# Patient Record
Sex: Male | Born: 1955 | Race: White | Hispanic: No | Marital: Married | Smoking: Never smoker
Health system: Southern US, Community
[De-identification: ages and names within clinical notes are randomized; demographics above are authoritative.]

## PROBLEM LIST (undated history)

## (undated) DIAGNOSIS — N200 Calculus of kidney: Secondary | ICD-10-CM

## (undated) DIAGNOSIS — F419 Anxiety disorder, unspecified: Secondary | ICD-10-CM

## (undated) DIAGNOSIS — E785 Hyperlipidemia, unspecified: Secondary | ICD-10-CM

## (undated) DIAGNOSIS — J301 Allergic rhinitis due to pollen: Secondary | ICD-10-CM

## (undated) DIAGNOSIS — B019 Varicella without complication: Secondary | ICD-10-CM

## (undated) DIAGNOSIS — J302 Other seasonal allergic rhinitis: Secondary | ICD-10-CM

## (undated) DIAGNOSIS — I10 Essential (primary) hypertension: Secondary | ICD-10-CM

## (undated) DIAGNOSIS — K219 Gastro-esophageal reflux disease without esophagitis: Secondary | ICD-10-CM

## (undated) DIAGNOSIS — I493 Ventricular premature depolarization: Secondary | ICD-10-CM

## (undated) DIAGNOSIS — F329 Major depressive disorder, single episode, unspecified: Secondary | ICD-10-CM

## (undated) DIAGNOSIS — F32A Depression, unspecified: Secondary | ICD-10-CM

## (undated) HISTORY — DX: Calculus of kidney: N20.0

## (undated) HISTORY — DX: Ventricular premature depolarization: I49.3

## (undated) HISTORY — DX: Other seasonal allergic rhinitis: J30.2

## (undated) HISTORY — DX: Varicella without complication: B01.9

## (undated) HISTORY — DX: Anxiety disorder, unspecified: F41.9

## (undated) HISTORY — DX: Allergic rhinitis due to pollen: J30.1

## (undated) HISTORY — DX: Essential (primary) hypertension: I10

## (undated) HISTORY — DX: Gastro-esophageal reflux disease without esophagitis: K21.9

## (undated) HISTORY — DX: Depression, unspecified: F32.A

## (undated) HISTORY — DX: Hyperlipidemia, unspecified: E78.5

## (undated) HISTORY — PX: NO PAST SURGERIES: SHX2092

---
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
## 2019-07-30 ENCOUNTER — Emergency Department
Admission: EM | Admit: 2019-07-30 | Discharge: 2019-07-30 | Disposition: A | Discharge status: Home / Self Care | Payer: BC Managed Care – PPO | Attending: Emergency Medicine | Admitting: Emergency Medicine

## 2019-07-30 ENCOUNTER — Emergency Department: Payer: BC Managed Care – PPO

## 2019-07-30 ENCOUNTER — Other Ambulatory Visit: Payer: Self-pay

## 2019-07-30 DIAGNOSIS — I493 Ventricular premature depolarization: Secondary | ICD-10-CM | POA: Insufficient documentation

## 2019-07-30 DIAGNOSIS — R002 Palpitations: Secondary | ICD-10-CM | POA: Diagnosis present

## 2019-07-30 LAB — BASIC METABOLIC PANEL
Anion gap: 8 (ref 5–15)
BUN: 26 mg/dL — ABNORMAL HIGH (ref 8–23)
CO2: 24 mmol/L (ref 22–32)
Calcium: 8.8 mg/dL — ABNORMAL LOW (ref 8.9–10.3)
Chloride: 107 mmol/L (ref 98–111)
Creatinine, Ser: 1.11 mg/dL (ref 0.61–1.24)
GFR calc Af Amer: >60 mL/min (ref >60)
GFR calc non Af Amer: >60 mL/min (ref >60)
Glucose, Bld: 104 mg/dL — ABNORMAL HIGH (ref 70–99)
Potassium: 4.2 mmol/L (ref 3.5–5.1)
Sodium: 139 mmol/L (ref 135–145)

## 2019-07-30 LAB — CBC
HCT: 42.7 % (ref 39.0–52.0)
Hemoglobin: 14.8 g/dL (ref 13.0–17.0)
MCH: 29 pg (ref 26.0–34.0)
MCHC: 34.7 g/dL (ref 30.0–36.0)
MCV: 83.7 fL (ref 80.0–100.0)
Platelets: 240 10*3/uL (ref 150–400)
RBC: 5.1 MIL/uL (ref 4.22–5.81)
RDW: 12 % (ref 11.5–15.5)
WBC: 5.6 10*3/uL (ref 4.0–10.5)
nRBC: 0 % (ref 0.0–0.2)

## 2019-07-30 LAB — TROPONIN I (HIGH SENSITIVITY): Troponin I (High Sensitivity): 4 ng/L (ref <18)

## 2019-07-30 MED ORDER — SODIUM CHLORIDE 0.9% FLUSH: 3.0000 mL | Freq: Once | INTRAVENOUS | Status: DC

## 2019-07-30 NOTE — ED Notes (Signed)
Pt c/o feeling like ",my heart skips a beat" states it has only happened once today, states it has been occurring over the past week and his parents have a hx of stroke and saw a commercial that talked about the increased risk of stroke if you have an irregular HB.Marland Kitchen pt is in NAD. Denies any pain or SOB. 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

## 2019-07-30 NOTE — ED Triage Notes (Signed)
Reports "missed beat" a few times per day for last week. Pt also reports bilateral tingling in arms over past few days-no decrease in strength. Denies CP or SOB.

## 2019-07-30 NOTE — ED Provider Notes (Signed)
[facility] Emergency Department Provider Note   ____________________________________________    I have reviewed the triage vital signs and the nursing notes.   HISTORY  Chief Complaint Palpitations     HPI Tim Reynolds is a 63 y.o. male who reports that over the last week he has noticed a sensation of having an extra beat intermittently.  He reports this happens perhaps 6-10 times per day.  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  He denies chest pain.  No shortness of breath.  No cough fever or chills.  Reports he has been drinking increased coffee.  Currently feels quite well, went to urgent care and they sent him here for evaluation.  History reviewed. No pertinent past medical history.  There are no problems to display for this patient.   History reviewed. No pertinent surgical history.  Prior to Admission medications   Not on File     Allergies Patient has no known allergies.  No family history on file.  Social History Does not smoke, does not drink alcohol frequently  Review of Systems  Constitutional: No fever/chills Eyes: No visual changes.  ENT: No sore throat. Cardiovascular: Denies chest pain.  As above Respiratory: Denies shortness of breath. Gastrointestinal: No abdominal pain.  Genitourinary: Negative for dysuria. Musculoskeletal: Negative for back pain. Skin: Negative for rash. Neurological: Negative for headaches   ____________________________________________   PHYSICAL EXAM:  VITAL SIGNS: ED Triage Vitals  Enc Vitals Group     BP 07/30/19 0915 (!) 150/82     Pulse Rate 07/30/19 0915 81     Resp 07/30/19 0915 16     Temp 07/30/19 0915 98.4 F (36.9 C)     Temp Source 07/30/19 0915 Oral     SpO2 07/30/19 0915 97 %     Weight 07/30/19 0916 72.6 kg (160 lb)     Height 07/30/19 0916 1.702 m (5\' 7" )   Head Circumference --      Peak Flow --      Pain Score 07/30/19 0916 0     Pain Loc --      Pain Edu? --      Excl. in [place]? --     Constitutional: Alert and oriented. No acute distress. Pleasant and interactive   Mouth/Throat: Mucous membranes are moist.   Neck:  Painless ROM Cardiovascular: Normal rate, regular rhythm. Grossly normal heart sounds.  Good peripheral circulation. Respiratory: Normal respiratory effort.  No retractions. Lungs CTAB. Gastrointestinal: Soft and nontender. No distention.    Musculoskeletal:.  Warm and well perfused Neurologic:  Normal speech and language. No gross focal neurologic deficits are appreciated.  Skin:  Skin is warm, dry and intact. No rash noted. Psychiatric: Mood and affect are normal. Speech and behavior are normal.  ____________________________________________   LABS (all labs ordered are listed, but only abnormal results are displayed)  Labs Reviewed  BASIC METABOLIC PANEL - Abnormal; Notable for the following components:      Result Value   Glucose, Bld 104 (*)    BUN 26 (*)    Calcium 8.8 (*)    All other components within normal limits  CBC  TROPONIN I (HIGH SENSITIVITY)   ____________________________________________  EKG  ED ECG REPORT I, Lavonia Drafts, the attending physician, personally viewed and interpreted this ECG.  Date: 07/30/2019  Rhythm: normal sinus rhythm QRS  Axis: normal Intervals: normal ST/T Wave abnormalities: normal Narrative Interpretation: no evidence of acute ischemia  ____________________________________________  RADIOLOGY  Chest x-ray unremarkable ____________________________________________   PROCEDURES  Procedure(s) performed: No  Procedures   Critical Care performed: No ____________________________________________   INITIAL IMPRESSION / ASSESSMENT AND PLAN / ED COURSE  Pertinent labs & imaging results that were available during my care of the patient were reviewed by me and  considered in my medical decision making (see chart for details).  Patient well-appearing and in no acute distress, asymptomatic at this time.  Symptoms are consistent with PVC, noted occasional PVC on the monitor.  Discussed with him that if symptoms become worse option for beta-blocker and recommend cardiology follow-up.  However recommend decreasing caffeine and increasing exercise and monitoring closely at this time.  Patient agrees with this plan    ____________________________________________   FINAL CLINICAL IMPRESSION(S) / ED DIAGNOSES  Final diagnoses:  PVC (premature ventricular contraction)        Note:  This document was prepared using Dragon voice recognition software and may include unintentional dictation errors.   Lavonia Drafts, MD 07/30/19 1051

## 2019-07-30 NOTE — ED Notes (Signed)
Patient transported to X-ray 

## 2019-08-21 ENCOUNTER — Encounter: Payer: Self-pay | Admitting: *Deleted

## 2019-08-27 ENCOUNTER — Ambulatory Visit (INDEPENDENT_AMBULATORY_CARE_PROVIDER_SITE_OTHER): Payer: BC Managed Care – PPO | Admitting: Cardiovascular Disease

## 2019-08-27 ENCOUNTER — Encounter: Payer: Self-pay | Admitting: Cardiovascular Disease

## 2019-08-27 ENCOUNTER — Other Ambulatory Visit: Payer: Self-pay

## 2019-08-27 VITALS — BP 133/86 | HR 78 | Ht 67.0 in | Wt 178.5 lb

## 2019-08-27 DIAGNOSIS — I493 Ventricular premature depolarization: Secondary | ICD-10-CM

## 2019-08-27 DIAGNOSIS — Z0000 Encounter for general adult medical examination without abnormal findings: Secondary | ICD-10-CM

## 2019-08-27 NOTE — Progress Notes (Signed)
Cardiology Office Note  Date:  08/27/2019   ID:  Youlanda Roys [place], DOB 04/05/1956, MRN [id number]  PCP:  Patient, No Pcp Per   Chief Complaint  Patient presents with  . OTHER    [facility] f/u palpitations no complaints today. Meds reviewed verbally with pt.    HPI:  Mr. Tim Reynolds is a 64 year old gentleman with no prior cardiac history Referred by Sarajane Jews for consultation of his palpitations/PVCs  Seen in the emergency room July 30, 2019 for palpitations Work-up in the ER consistent with PVCs, noted to be occasional on the monitor TNT negative BUN 26 creatinine 1.1 Normal CBC  Feels skipping , worse at night, Started in dec 2020 Noticed some last night  No exercise, past year Prior to that was biking Weight up 10 pounds  Teacher Owns a farm, good exercise tolerance  EKG personally reviewed by myself on todays visit NSR rate 78 bpm, no ST or T wave changes  EKG from the emergency room showing normal sinus rhythm rate 72 bpm no significant ST-T wave changes    PMH:   has a past medical history of Anxiety and Depression.  PSH:   History reviewed. No pertinent surgical history.  Current Outpatient Medications  Medication Sig Dispense Refill  . aspirin 81 MG EC tablet Take by mouth daily.    . diphenhydrAMINE (BENADRYL) 25 MG tablet Take 25 mg by mouth as needed.     No current facility-administered medications for this visit.     Allergies:   Patient has no active allergies.   Social History:  The patient  reports that he has never smoked. He has never used smokeless tobacco. He reports current alcohol use. He reports that he does not use drugs.   Family History:   family history includes Arrhythmia in his mother; Heart Problems in his father; Hypertension in his mother; Stroke in his father.    Review of Systems: Review of Systems  Constitutional: Negative.   HENT: Negative.   Respiratory: Negative.   Cardiovascular: Positive for palpitations.   Gastrointestinal: Negative.   Musculoskeletal: Negative.   Neurological: Negative.   Psychiatric/Behavioral: Negative.   All other systems reviewed and are negative.    PHYSICAL EXAM: VS:  BP 133/86 (BP Location: Right Arm, Patient Position: Sitting, Cuff Size: Normal)   Pulse 78   Ht 5\' 7"  (1.702 m)   Wt 178 lb 8 oz (81 kg)   SpO2 98%   BMI 27.96 kg/m  , BMI Body mass index is 27.96 kg/m. GEN: Well nourished, well developed, in no acute distress HEENT: normal Neck: no JVD, carotid bruits, or masses Cardiac: RRR; no murmurs, rubs, or gallops,no edema  Respiratory:  clear to auscultation bilaterally, normal work of breathing GI: soft, nontender, nondistended, + BS MS: no deformity or atrophy Skin: warm and dry, no rash Neuro:  Strength and sensation are intact Psych: euthymic mood, full affect   Recent Labs: 07/30/2019: BUN 26; Creatinine, Ser 1.11; Hemoglobin 14.8; Platelets 240; Potassium 4.2; Sodium 139    Lipid Panel No results found for: CHOL, HDL, LDLCALC, TRIG    Wt Readings from Last 3 Encounters:  08/27/19 178 lb 8 oz (81 kg)  07/30/19 160 lb (72.6 kg)       ASSESSMENT AND PLAN:  Problem List Items Addressed This Visit    None    Visit Diagnoses    PVC (premature ventricular contraction)    -  Primary   Relevant Orders   EKG 12-Lead  Encounter for preventive care         PVCs Rare, minimal sx, Monitor offered,he will call if he would like a zio patch He prefers no b-blocker at this time Discussed various types of beta-blockers available if he chooses  Preventive care Offered CT coronary calcium score, he has declined at this time -Suggested primary care We will order labs, lipids   Disposition:   F/U  As needed   Total encounter time more than 60 minutes  Greater than 50% was spent in counseling and coordination of care with the patient  Patient was seen in consultation for Sarajane Jews and will be referred back to his office for  ongoing care of the issues detailed above    Signed, Esmond Plants, M.D., Ph.D. [place], [place]

## 2019-08-27 NOTE — Patient Instructions (Addendum)
Info for CT coronary calcium score  Medication Instructions:  No changes  If you need a refill on your cardiac medications before your next appointment, please call your pharmacy.    Lab work: Lipids with [facility]. Please make sure not to eat or drink anything after midnight prior except water with your medications.    If you have labs (blood work) drawn today and your tests are completely normal, you will receive your results only by: Marland Kitchen MyChart Message (if you have MyChart) OR . A paper copy in the mail If you have any lab test that is abnormal or we need to change your treatment, we will call you to review the results.   Testing/Procedures: No new testing needed   Follow-Up: At [facility], you and your health needs are our priority.  As part of our continuing mission to provide you with exceptional heart care, we have created designated Provider Care Teams.  These Care Teams include your primary Cardiologist (physician) and Advanced Practice Providers (APPs -  Physician Assistants and Nurse Practitioners) who all work together to provide you with the care you need, when you need it.  . You will need a follow up appointment as needed  . Providers on your designated Care Team:   . Murray Hodgkins, NP . Christell Faith, PA-C . Marrianne Mood, PA-C  Any Other Special Instructions Will Be Listed Below (If Applicable).  For educational health videos Log in to : [email or web address] Or : [email or web address], password : [email or web address]

## 2020-11-13 ENCOUNTER — Ambulatory Visit: Payer: BC Managed Care – PPO | Admitting: Physician Assistant

## 2020-11-13 ENCOUNTER — Encounter: Payer: Self-pay | Admitting: Physician Assistant

## 2020-11-13 ENCOUNTER — Other Ambulatory Visit: Payer: Self-pay

## 2020-11-13 VITALS — BP 136/82 | HR 74 | Ht 67.0 in | Wt 164.0 lb

## 2020-11-13 DIAGNOSIS — R072 Precordial pain: Secondary | ICD-10-CM

## 2020-11-13 DIAGNOSIS — R002 Palpitations: Secondary | ICD-10-CM

## 2020-11-13 DIAGNOSIS — I493 Ventricular premature depolarization: Secondary | ICD-10-CM | POA: Diagnosis not present

## 2020-11-13 DIAGNOSIS — R0789 Other chest pain: Secondary | ICD-10-CM | POA: Diagnosis not present

## 2020-11-13 MED ORDER — CARVEDILOL 3.125 MG PO TABS: 3.1250 mg | ORAL_TABLET | Freq: Two times a day (BID) | ORAL | 3 refills | Status: DC

## 2020-11-13 MED ORDER — METOPROLOL TARTRATE 100 MG PO TABS: 100.0000 mg | ORAL_TABLET | Freq: Once | ORAL | 0 refills | Status: DC

## 2020-11-13 NOTE — Patient Instructions (Signed)
Medication Instructions:  Your physician has recommended you make the following change in your medication:   1.  START Carvedilol (Coreg) 3.125 MG twice a day.  *If you need a refill on your cardiac medications before your next appointment, please call your pharmacy*   Lab Work: BMP and TSH to be drawn today.   Testing/Procedures:  1.  Your physician has requested that you have coronary cardiac CT. Cardiac computed tomography (CT) is a painless test that uses an x-ray machine to take clear, detailed pictures of your heart.  Your cardiac CT will be scheduled at:  [facility] [address] [place], [place] [address] [phone]  Please arrive 15 mins early for check-in and test prep.   Please follow these instructions carefully (unless otherwise directed):   On the Night Before the Test: . Be sure to Drink plenty of water. . Do not consume any caffeinated/decaffeinated beverages or chocolate 12 hours prior to your test. . Do not take any antihistamines 12 hours prior to your test.   On the Day of the Test: . Drink plenty of water until 1 hour prior to the test. . Do not eat any food 4 hours prior to the test. . You may take your regular medications prior to the test.  . Take metoprolol (Lopressor) two hours prior to test. (this was sent to pharmacy)       After the Test: . Drink plenty of water. . After receiving IV contrast, you may experience a mild flushed feeling. This is normal. . On occasion, you may experience a mild rash up to 24 hours after the test. This is not dangerous. If this occurs, you can take Benadryl 25 mg and increase your fluid intake. . If you experience trouble breathing, this can be serious. If it is severe call 911 IMMEDIATELY. If it is mild, please call our office. . If you take any of these medications: Glipizide/Metformin, Avandament, Glucavance, please do not take 48 hours after completing test  unless otherwise instructed.   Once we have confirmed authorization from your insurance company, we will call you to set up a date and time for your test. Based on how quickly your insurance processes prior authorizations requests, please allow up to 4 weeks to be contacted for scheduling your Cardiac CT appointment. Be advised that routine Cardiac CT appointments could be scheduled as many as 8 weeks after your provider has ordered it.  For non-scheduling related questions, please contact the cardiac imaging nurse navigator should you have any questions/concerns: Marchia Bond, Cardiac Imaging Nurse Navigator Gordy Clement, Cardiac Imaging Nurse Navigator [facility] Direct Office Dial: [phone]   For scheduling needs, including cancellations and rescheduling, please call [place], [phone].    Follow-Up: At [facility], you and your health needs are our priority.  As part of our continuing mission to provide you with exceptional heart care, we have created designated Provider Care Teams.  These Care Teams include your primary Cardiologist (physician) and Advanced Practice Providers (APPs -  Physician Assistants and Nurse Practitioners) who all work together to provide you with the care you need, when you need it.  We recommend signing up for the patient portal called "MyChart".  Sign up information is provided on this After Visit Summary.  MyChart is used to connect with patients for Virtual Visits (Telemedicine).  Patients are able to view lab/test results, encounter notes, upcoming appointments, etc.  Non-urgent messages can be sent  to your provider as well.   To learn more about what you can do with MyChart, go to [email or web address].    Your next appointment:   5 week(s)  The format for your next appointment:   In Person  Provider:   You may see Dr. Rockey Situ or one of the following Advanced Practice Providers on your designated Care Team:     Murray Hodgkins, NP  Christell Faith, PA-C  Marrianne Mood, PA-C  Cadence Tanaina, [place]  Laurann Montana, NP    Other Instructions

## 2020-11-13 NOTE — Progress Notes (Signed)
Office Visit    Patient Name: Tim Reynolds Date of Encounter: 11/13/2020  PCP:  Patient, No Pcp Per (Inactive)   [place]  Cardiologist:  Ida Rogue, MD  Advanced Practice Provider:  No care team member to display Electrophysiologist:  None :[phone]}   Chief Complaint    Chief Complaint  Patient presents with  . Follow-up    PVCs  Pt states he has been having chest pain/tightness---has been worse than usual for the last week, but has subsided some since he called to make the appt for today    65 year old male with history of PVCs and here today for neck and arm pain with palpitations.  Past Medical History    Past Medical History:  Diagnosis Date  . Anxiety   . Depression    No past surgical history on file.  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 He reports family history includes a father with strokes and mother with heart issues.    Seen in the ED 07/2019 for palpitations.  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  He is followed by Dr. Rockey Situ of [facility] cardiology with previous visit 08/27/2019.  He was referred at that time for palpitations and PVCs.  No prior cardiac history.  He reported palpitations for worse at night.  He had an increased sedentary lifestyle.  In the past, he was biking.  Weight was up 10 pounds.  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  EKG NSR without acute ST/T changes.    Today, 11/13/2020, he returns to clinic and notes ongoing palpitations.  He feels as if his palpitations occur daily with frequency varying, depending on the day.  In addition to his palpitations, he has noted neck and arm pain.  He started to notice sx 1 to 2 weeks ago.  Palpitations usually occur when seated or standing still, so that he is able to notice them.  He continues to work  on a farm and notes good exercise tolerance.  He has noticed some neck and arm pain, and he is uncertain if it is of MSK etiology from throwing around hay barrels versus cardiac.  He reports decreased range of motion of his neck/neck tightness.  He also feels tight in his left arm.  His appetite has decreased.  He occasionally feels congested and lightheaded though notes a fever.  We reviewed diet with recommendation for low salt and monitoring of fluid.  Mediterranean/heart healthy diet discussed.  Also discussed was recommendation for exercise.  He feels as if he is deconditioned, as he is "not as fit as in the past." He reports drinking 2 cups of coffee per day.  He has intermittent reflux.  He denies shortness of breath but feels as if he is breathing harder at times.  He reports rare alcohol use, such as a beer every 6 months.  No reported signs or symptoms of bleeding.  No signs or symptoms of volume overload.  Home Medications    Current Outpatient Medications on File Prior to Visit  Medication Sig Dispense Refill  . aspirin 81 MG EC tablet Take by mouth daily.    . diphenhydrAMINE (BENADRYL) 25 MG tablet Take 25 mg by mouth as needed.     No current facility-administered medications  on file prior to visit.    Review of Systems    He reports palpitations, neck pain that can tighten into chest, left arm pain, breathing harder at times, occasional lightheadedness, early satiety, GERD. He denies chest pain, pnd, orthopnea, n, v, syncope, edema, weight gain.    All other systems reviewed and are otherwise negative except as noted above.  Physical Exam    VS:  BP 136/82   Pulse 74   Ht 5\' 7"  (1.702 m)   Wt 164 lb (74.4 kg)   BMI 25.69 kg/m  , BMI Body mass index is 25.69 kg/m. GEN: Well nourished, well developed, in no acute distress. HEENT: normal. Neck: Supple, no JVD, carotid bruits, or masses. Cardiac: RRR, no murmurs, rubs, or gallops. No clubbing, cyanosis, edema.  Radials/DP/PT  2+ and equal bilaterally.  Respiratory:  Respirations regular and unlabored, clear to auscultation bilaterally. GI: Soft, nontender, nondistended, BS + x 4. MS: no deformity or atrophy. Skin: warm and dry, no rash. Neuro:  Strength and sensation are intact. Psych: Normal affect.  Accessory Clinical Findings    ECG personally reviewed by me today - NSR, 74 bpm, poor R wave progression in lead III suspect due to lead placement- no acute changes.  VITALS Reviewed today   Temp Readings from Last 3 Encounters:  07/30/19 97.9 F (36.6 C) (Oral)   BP Readings from Last 3 Encounters:  11/13/20 136/82  08/27/19 133/86  07/30/19 132/78   Pulse Readings from Last 3 Encounters:  11/13/20 74  08/27/19 78  07/30/19 78    Wt Readings from Last 3 Encounters:  11/13/20 164 lb (74.4 kg)  08/27/19 178 lb 8 oz (81 kg)  07/30/19 160 lb (72.6 kg)     LABS  reviewed today   Lab Results  Component Value Date   WBC 5.6 07/30/2019   HGB 14.8 07/30/2019   HCT 42.7 07/30/2019   MCV 83.7 07/30/2019   PLT 240 07/30/2019   Lab Results  Component Value Date   CREATININE 1.11 07/30/2019   BUN 26 (H) 07/30/2019   NA 139 07/30/2019   K 4.2 07/30/2019   CL 107 07/30/2019   CO2 24 07/30/2019   No results found for: ALT, AST, GGT, ALKPHOS, BILITOT No results found for: CHOL, HDL, LDLCALC, LDLDIRECT, TRIG, CHOLHDL  No results found for: HGBA1C No results found for: TSH   STUDIES/PROCEDURES reviewed today   None  Assessment & Plan    Palpitations/PVCs --Reports worsening palpitations, usually noted whenever he is still and able to pay closer attention to them.  Will start carvedilol 3.125 mg twice daily. Will check TSH, electrolytes. Reassess at RTC.  Atypical chest pain  left arm pain/cervical pain --In addition to increased palpitations, he reports left arm and cervical pain with concern for coronary insufficiency.  Agreeable to coronary CTA.  Further recommendations following this  imaging.  Recommend risk factor modification, including addition of statin if LDL uncontrolled.  Check lipids and liver function at RTC.  Medication changes: Carvedilol 3.125 mg twice daily Labs ordered: BMET, TSH Studies / Imaging ordered: Coronary CTA Future considerations: Echo, ambulatory cardiac monitoring with Zio XT x2 weeks, lipid and liver function Disposition: RTC 5 weeks or s/p coronary CT   Arvil Chaco, PA-C 11/13/2020

## 2020-11-14 LAB — BASIC METABOLIC PANEL
BUN/Creatinine Ratio: 23 (ref 10–24)
BUN: 28 mg/dL — ABNORMAL HIGH (ref 8–27)
CO2: 19 mmol/L — ABNORMAL LOW (ref 20–29)
Calcium: 9.6 mg/dL (ref 8.6–10.2)
Chloride: 105 mmol/L (ref 96–106)
Creatinine, Ser: 1.24 mg/dL (ref 0.76–1.27)
Glucose: 131 mg/dL — ABNORMAL HIGH (ref 65–99)
Potassium: 4.3 mmol/L (ref 3.5–5.2)
Sodium: 142 mmol/L (ref 134–144)
eGFR: 65 mL/min/{1.73_m2} (ref >59)

## 2020-11-14 LAB — TSH: TSH: 1.13 u[IU]/mL (ref 0.450–4.500)

## 2020-12-10 ENCOUNTER — Telehealth (HOSPITAL_COMMUNITY): Payer: Self-pay | Admitting: Emergency Medicine

## 2020-12-10 NOTE — Telephone Encounter (Signed)
Attempted to call patient regarding upcoming cardiac CT appointment. °Left message on voicemail with name and callback number °Glayds Insco RN Navigator Cardiac Imaging °[facility] °[phone] Office °[phone] Cell ° °

## 2020-12-11 ENCOUNTER — Other Ambulatory Visit: Payer: Self-pay

## 2020-12-11 ENCOUNTER — Ambulatory Visit
Admission: RE | Admit: 2020-12-11 | Discharge: 2020-12-11 | Disposition: A | Discharge status: Home / Self Care | Payer: BC Managed Care – PPO | Source: Ambulatory Visit | Attending: Physician Assistant | Admitting: Physician Assistant

## 2020-12-11 DIAGNOSIS — R072 Precordial pain: Secondary | ICD-10-CM | POA: Insufficient documentation

## 2020-12-11 MED ORDER — NITROGLYCERIN 0.4 MG SL SUBL
0.8000 mg | SUBLINGUAL_TABLET | Freq: Once | SUBLINGUAL | Status: AC
  Administered 2020-12-11: 0.8 mg via SUBLINGUAL

## 2020-12-11 MED ORDER — IOHEXOL 350 MG/ML SOLN
75.0000 mL | Freq: Once | INTRAVENOUS | Status: AC | PRN
  Administered 2020-12-11: 75 mL via INTRAVENOUS

## 2020-12-11 NOTE — Progress Notes (Signed)
Patient tolerated procedure well. Ambulate w/o difficulty. Sitting in chair drinking water provided. Encouraged to drink extra water today and reasoning explained. Verbalized understanding. All questions answered. ABC intact. No further needs. Discharge from procedure area w/o issues.  

## 2020-12-18 ENCOUNTER — Telehealth: Payer: Self-pay | Admitting: *Deleted

## 2020-12-18 NOTE — Telephone Encounter (Signed)
Reviewed results with patient and he verbalized understanding with no further questions at this time. 

## 2020-12-18 NOTE — Telephone Encounter (Signed)
Left voicemail message to call back for review of results.  

## 2020-12-18 NOTE — Telephone Encounter (Signed)
-----   Message from Arvil Chaco, PA-C sent at 12/18/2020 12:26 PM EDT ----- Coronary CT study shows --CAC score 1.04, which is very reassuring and places him in the 24th percentile for age and sex matched control. --No evidence of CAD.

## 2020-12-23 NOTE — Progress Notes (Signed)
Office Visit    Patient Name: Tim Reynolds Date of Encounter: 12/24/2020  PCP:  Patient, No Pcp Per (Inactive)   [facility]  Cardiologist:  Ida Rogue, MD  Advanced Practice Provider:  No care team member to display Electrophysiologist:  None :[id number]}   Chief Complaint    Chief Complaint  Patient presents with  . Follow-up    5 Week follow up. Medications verbally reviewed with patient.    65 year old male with history of PVCs and here today for neck and arm pain with palpitations.  Past Medical History    Past Medical History:  Diagnosis Date  . Anxiety   . Depression    History reviewed. No pertinent surgical history.  Allergies  No Active Allergies  History of Present Illness    Tim Reynolds is a 65 y.o. male with PMH as above.  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 He reports family history includes a father with strokes and mother with heart issues.    Seen in the ED 07/2019 for palpitations.  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  He is followed by [facility] with previous visit 08/27/2019.  He was referred at that time for palpitations and PVCs.  No prior cardiac history.  He reported palpitations for worse at night.  He had an increased sedentary lifestyle.  In the past, he was biking.  Weight was up 10 pounds.  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  EKG NSR without acute ST/T changes.    Seen 11/13/2020 with ongoing palpitations that occurred with varying frequency and depending on the day.  He also noted neck and arm pain.  He noticed onset of symptoms 1 to 2 weeks prior.  Palpitations usually occurred when seated or standing still, though he also noted that was when he was more likely to notice them.  He was working on his farm and noted with good exercise tolerance.  He did note some neck and arm pain, uncertain if MSK etiology was contributing.  He  reported decreased range of motion in neck tightness.  Felt tightness in his left arm.  Appetite was decreased.  He felt congested a bit lightheaded, reporting he often had a fever.  He felt as if he was deconditioned, reportedly not as fit as in the past.  He was drinking 2 cups of coffee per day.  He had intermittent reflux.  He reported breathing harder at times but denies shortness of breath at rest.  He reported rare alcohol use, such as a beer every 6 months.  He was started on carvedilol for his above symptoms.  Today, 12/24/2020, he returns to clinic and notes that he is feeling about the same as his previous clinic visits.  BP today very similar to that of previous at 130/82, previously 136/82.  He reports ongoing palpitations without much change since starting carvedilol.  He again describes the palpitations as without clear triggers.  No association with food or caffeine.  He does not drink frequently and denies any association with alcohol.  He continues to report that he is active on the farm without regular exercise routine, though he does intend to work on this and hopefully be more active in the future.  Overall, symptoms are similar to that previously reported as above.  Home Medications    Current Outpatient Medications on File Prior to Visit  Medication  Sig Dispense Refill  . aspirin 81 MG EC tablet Take by mouth daily.    . carvedilol (COREG) 3.125 MG tablet Take 1 tablet (3.125 mg total) by mouth 2 (two) times daily. 60 tablet 3  . diphenhydrAMINE (BENADRYL) 25 MG tablet Take 25 mg by mouth as needed.    . metoprolol tartrate (LOPRESSOR) 100 MG tablet Take 1 tablet (100 mg total) by mouth once for 1 dose. Take 2 hours prior to your CT scan. 1 tablet 0   No current facility-administered medications on file prior to visit.    Review of Systems    He reports ujnchanged palpitations, neck pain that can tighten into chest, left arm pain, breathing harder at times, occasional  lightheadedness, early satiety, GERD. He denies chest pain, pnd, orthopnea, n, v, syncope, edema, weight gain.    All other systems reviewed and are otherwise negative except as noted above.  Physical Exam    VS:  BP 130/82 (BP Location: Left Arm, Patient Position: Sitting, Cuff Size: Normal)   Pulse 66   Ht 5\' 7"  (1.702 m)   Wt 166 lb (75.3 kg)   SpO2 98%   BMI 26.00 kg/m  , BMI Body mass index is 26 kg/m. GEN: Well nourished, well developed, in no acute distress. HEENT: normal. Neck: Supple, no JVD, carotid bruits, or masses. Cardiac: RRR, no murmurs, rubs, or gallops. No clubbing, cyanosis, edema.  Radials/DP/PT 2+ and equal bilaterally.  Respiratory:  Respirations regular and unlabored, clear to auscultation bilaterally. GI: Soft, nontender, nondistended, BS + x 4. MS: no deformity or atrophy. Skin: warm and dry, no rash. Neuro:  Strength and sensation are intact. Psych: Normal affect.  Accessory Clinical Findings    ECG personally reviewed by me today -No EKG- no acute changes.  VITALS Reviewed today   Temp Readings from Last 3 Encounters:  07/30/19 97.9 F (36.6 C) (Oral)   BP Readings from Last 3 Encounters:  12/24/20 130/82  12/11/20 115/71  11/13/20 136/82   Pulse Readings from Last 3 Encounters:  12/24/20 66  12/11/20 (!) 54  11/13/20 74    Wt Readings from Last 3 Encounters:  12/24/20 166 lb (75.3 kg)  11/13/20 164 lb (74.4 kg)  08/27/19 178 lb 8 oz (81 kg)     LABS  reviewed today   Lab Results  Component Value Date   WBC 5.6 07/30/2019   HGB 14.8 07/30/2019   HCT 42.7 07/30/2019   MCV 83.7 07/30/2019   PLT 240 07/30/2019   Lab Results  Component Value Date   CREATININE 1.24 11/13/2020   BUN 28 (H) 11/13/2020   NA 142 11/13/2020   K 4.3 11/13/2020   CL 105 11/13/2020   CO2 19 (L) 11/13/2020   No results found for: ALT, AST, GGT, ALKPHOS, BILITOT No results found for: CHOL, HDL, LDLCALC, LDLDIRECT, TRIG, CHOLHDL  No results found for:  HGBA1C Lab Results  Component Value Date   TSH 1.130 11/13/2020     STUDIES/PROCEDURES reviewed today   None  Assessment & Plan    Palpitations/PVCs --Reports unchanged palpitations, usually noted whenever he is still and able to pay closer attention to them.  Coronary CTA as above and with low CAC score.  Continue carvedilol 3.125 mg twice daily. Will complete 2w Zio XT. After discussion with patient regarding echocardiogram, will defer for now and reassess at RTC.  Hypertension -- We reviewed possible etiologies of ongoing elevated pressure, given his BP has remained relatively unchanged despite addition  of carvedilol.  He reports a low-sodium diet and does not frequently drink alcohol or caffeine.  Sleep apnea was discussed with watch Pat deferred at this time.  Reassess at RTC.  Patient preference at this time is for lifestyle changes over that of medication if possible.    Medication changes: None Labs ordered: None Studies / Imaging ordered: Zio XT x2 weeks Future considerations: Echo, lipid and liver function if not done by PCP Disposition: RTC after Milford Cage, PA-C 12/24/2020

## 2020-12-24 ENCOUNTER — Other Ambulatory Visit: Payer: Self-pay

## 2020-12-24 ENCOUNTER — Encounter: Payer: Self-pay | Admitting: Physician Assistant

## 2020-12-24 ENCOUNTER — Ambulatory Visit (INDEPENDENT_AMBULATORY_CARE_PROVIDER_SITE_OTHER): Payer: BC Managed Care – PPO

## 2020-12-24 ENCOUNTER — Ambulatory Visit: Payer: BC Managed Care – PPO | Admitting: Physician Assistant

## 2020-12-24 VITALS — BP 130/82 | HR 66 | Ht 67.0 in | Wt 166.0 lb

## 2020-12-24 DIAGNOSIS — R002 Palpitations: Secondary | ICD-10-CM

## 2020-12-24 DIAGNOSIS — I493 Ventricular premature depolarization: Secondary | ICD-10-CM

## 2020-12-24 DIAGNOSIS — R0789 Other chest pain: Secondary | ICD-10-CM

## 2020-12-24 NOTE — Patient Instructions (Addendum)
Medication Instructions:   1. Your physician recommends that you continue on your current medications as directed. Please refer to the Current Medication list given to you today.  *If you need a refill on your cardiac medications before your next appointment, please call your pharmacy*   Lab Work:  1. None Ordered  If you have labs (blood work) drawn today and your tests are completely normal, you will receive your results only by: Marland Kitchen MyChart Message (if you have MyChart) OR . A paper copy in the mail If you have any lab test that is abnormal or we need to change your treatment, we will call you to review the results.   Testing/Procedures:  1. Your physician has recommended that you wear a Zio monitor.   This monitor is a medical device that records the heart's electrical activity. Doctors most often use these monitors to diagnose arrhythmias. Arrhythmias are problems with the speed or rhythm of the heartbeat. The monitor is a small device applied to your chest. You can wear one while you do your normal daily activities. While wearing this monitor if you have any symptoms to push the button and record what you felt. Once you have worn this monitor for the period of time provider prescribed (Usually 14 days), you will return the monitor device in the postage paid box. Once it is returned they will download the data collected and provide [place] with a report which the provider will then review and we will call you with those results. Important tips:  1. Avoid showering during the first 24 hours of wearing the monitor. 2. Avoid excessive sweating to help maximize wear time. 3. Do not submerge the device, no hot tubs, and no swimming pools. 4. Keep any lotions or oils away from the patch. 5. After 24 hours you may shower with the patch on. Take brief showers with your back facing the shower head.  6. Do not remove patch once it has been placed because that will interrupt data and decrease adhesive  wear time. 7. Push the button when you have any symptoms and write down what you were feeling. 8. Once you have completed wearing your monitor, remove and place into box which has postage paid and place in your outgoing mailbox.  9. If for some reason you have misplaced your box then call our office and we can provide another box and/or mail it off for you.   Follow-Up: At [facility], you and your health needs are our priority.  As part of our continuing mission to provide you with exceptional heart care, we have created designated Provider Care Teams.  These Care Teams include your primary Cardiologist (physician) and Advanced Practice Providers (APPs -  Physician Assistants and Nurse Practitioners) who all work together to provide you with the care you need, when you need it.  We recommend signing up for the patient portal called "MyChart".  Sign up information is provided on this After Visit Summary.  MyChart is used to connect with patients for Virtual Visits (Telemedicine).  Patients are able to view lab/test results, encounter notes, upcoming appointments, etc.  Non-urgent messages can be sent to your provider as well.   To learn more about what you can do with MyChart, go to [email or web address].    Your next appointment:   - After ZIO  The format for your next appointment:   In Person  Provider:    You may see Ida Rogue, MD or   Marrianne Mood, PA-C  Other Instructions  If your blood pressure is consistently running over 130/80 at home, call the office.

## 2020-12-29 DIAGNOSIS — I493 Ventricular premature depolarization: Secondary | ICD-10-CM | POA: Diagnosis not present

## 2020-12-29 DIAGNOSIS — R002 Palpitations: Secondary | ICD-10-CM | POA: Diagnosis not present

## 2021-02-05 ENCOUNTER — Telehealth: Payer: Self-pay | Admitting: Cardiovascular Disease

## 2021-02-05 ENCOUNTER — Telehealth: Payer: Self-pay

## 2021-02-05 NOTE — Telephone Encounter (Signed)
Provider released result to mychart with her recommendations. Results have been reviewed by the patient in mychart. Called the pt to make sure he did not have any questions. Pt phone rings out with no voicemail option. Patient has an appt on 02/11/21 with Dr. Rockey Situ.     Visser, Jacquelyn D, PA-C  P Cv Div [facility] Triage MyChart message sent to pt with monitor results.  Monitor showed NSR, 1 brief run of NSVT, and PACs/PVCs  Overall, no concerning sustained arrhythmias.  Recommended he call to schedule an echo before his visit with Dr. Rockey Situ in July, if agreeable.  Otherwise, follow-up with Dr. Rockey Situ as scheduled.

## 2021-02-05 NOTE — Telephone Encounter (Signed)
Called patient to schedule  No order placed Patient would also like to know why we need an echo, requested to speak with nurse Please call to discuss

## 2021-02-05 NOTE — Telephone Encounter (Signed)
Attempted to reach pt via phone, no answer, LMTCB for any questions. Verify his upcoming appt on 7/6 with Dr. Camille Bal at that time, can discuss need for echo No order at this time Will schedule echo if Dr. Rockey Situ orders at his appt on 7/6.

## 2021-02-05 NOTE — Telephone Encounter (Signed)
-----   Message from Britt Bottom, [place] sent at 02/05/2021 10:20 AM EDT ----- Pt has follow up with TG 7/6 and was recommended to have echo before visit. Please contact pt for echo.  #Nurse please advise if ok for pt to keep appointment if echo not completed or if pt should reschedule after echo.

## 2021-02-10 ENCOUNTER — Other Ambulatory Visit: Payer: Self-pay | Admitting: *Deleted

## 2021-02-10 MED ORDER — CARVEDILOL 3.125 MG PO TABS: 3.1250 mg | ORAL_TABLET | Freq: Two times a day (BID) | ORAL | 0 refills | Status: DC

## 2021-02-11 ENCOUNTER — Encounter: Payer: Self-pay | Admitting: Cardiovascular Disease

## 2021-02-11 ENCOUNTER — Ambulatory Visit: Payer: BC Managed Care – PPO | Admitting: Cardiovascular Disease

## 2021-02-11 ENCOUNTER — Other Ambulatory Visit: Payer: Self-pay

## 2021-02-11 VITALS — BP 130/70 | HR 62 | Ht 68.0 in | Wt 168.0 lb

## 2021-02-11 DIAGNOSIS — I493 Ventricular premature depolarization: Secondary | ICD-10-CM

## 2021-02-11 DIAGNOSIS — R0789 Other chest pain: Secondary | ICD-10-CM

## 2021-02-11 DIAGNOSIS — R002 Palpitations: Secondary | ICD-10-CM | POA: Diagnosis not present

## 2021-02-11 DIAGNOSIS — R030 Elevated blood-pressure reading, without diagnosis of hypertension: Secondary | ICD-10-CM

## 2021-02-11 MED ORDER — CARVEDILOL 3.125 MG PO TABS: 3.1250 mg | ORAL_TABLET | Freq: Two times a day (BID) | ORAL | 3 refills | Status: DC

## 2021-02-11 NOTE — Patient Instructions (Addendum)
Medication Instructions:  No changes, please continue your current medications   If you need a refill on your cardiac medications before your next appointment, please call your pharmacy.   Lab work: No new labs needed  Testing/Procedures: No new testing needed  Follow-Up: At [facility], you and your health needs are our priority.  As part of our continuing mission to provide you with exceptional heart care, we have created designated Provider Care Teams.  These Care Teams include your primary Cardiologist (physician) and Advanced Practice Providers (APPs -  Physician Assistants and Nurse Practitioners) who all work together to provide you with the care you need, when you need it.  You will need a follow up appointment as needed  Providers on your designated Care Team:   Murray Hodgkins, NP Christell Faith, PA-C Marrianne Mood, PA-C [place]ence [place], [place]  COVID-19 Vaccine Information can be found at: [email or web address].uk For questions related to vaccine distribution or appointments, please email [email or web address] or call [phone].   Please monitor blood pressures and keep a log of your readings.  Upload numbers to MyChart  1-2  weeks with BP readings for review  How to use a home blood pressure monitor. Be still. Measure at the same time every day. It's important to take the readings at the same time each day, such as morning and evening. Take reading approximately 1 1/2 to 2 hours after BP medications.   AVOID these things for 30 minutes before checking your blood pressure: Drinking caffeine. Drinking alcohol. Eating. Smoking. Exercising.   Five minutes before checking your blood pressure: Pee. Sit in a dining chair. Avoid sitting in a soft couch or armchair. Be quiet. Do not talk.       Sit correctly. Sit with your back straight and supported (on a dining chair, rather than a sofa). Your feet should be flat  on the floor and your legs should not be crossed. Your arm should be supported on a flat surface (such as a table) with the upper arm at heart level. Make sure the bottom of the cuff is placed directly above the bend of the elbow.

## 2021-02-11 NOTE — Progress Notes (Signed)
Cardiology Office Note  Date:  02/11/2021   ID:  Tim Reynolds, DOB January 21, 1956, MRN [id number]  PCP:  Patient, No Pcp Per (Inactive)   Chief Complaint  Patient presents with   Follow up Zio monitor    "Doing well." Medications reviewed by the patient verbally.     HPI:  Tim Reynolds is a 65 year old gentleman with no prior cardiac history Presents for f/u of his palpitations/PVCs  LOV with myself 08/2019 Seen by provider 12/24/2020 Seen 11/13/2020 with ongoing palpitations  We reviewed recent cardiac imaging studies Cardiac CTA 1. Coronary calcium score of 1.04. This was 24th percentile for age and sex matched control. 2. Normal coronary origin with right dominance. 3. No angiographic evidence of CAD. 4. Consider non-atherosclerotic causes of chest pain.  Zio reviewed in detail Patient had a min HR of 46 bpm, max HR of 174 bpm, and avg HR of 67 bpm. Predominant underlying rhythm was Sinus Rhythm. 1 run of Ventricular Tachycardia occurred lasting 11 beats with a max rate of 174 bpm (avg 159 bpm). 7 Supraventricular Tachycardia runs occurred, the run with the fastest interval lasting 7 beats with a max rate of 143 bpm, the longest lasting 14 beats with an avg rate of 111 bpm.  Isolated SVEs were rare (<1.0%, 482), SVE Couplets were rare (<1.0%, 26), and no SVE Triplets were present. Isolated VEs were rare (<1.0%), and no VE Couplets or VE Triplets were present. Ventricular Bigeminy and Trigeminy were present. Patient triggered events (>80)  associated with sinus rhythm and rare PVCs  Teacher Owns a farm, good exercise tolerance  Has symptomatic PVCs, currently on carvedilol 3.125 twice daily Was told he needed this for hypertension Blood pressure elevated on doctor office visits, has not been checking his blood pressure at home In the past blood pressure was not an issue  EKG personally reviewed by myself on todays visit Normal sinus rhythm rate 62 bpm no significant ST-T wave  changes   PMH:   has a past medical history of Anxiety and Depression.  PSH:   History reviewed. No pertinent surgical history.  Current Outpatient Medications  Medication Sig Dispense Refill   diphenhydrAMINE (BENADRYL) 25 MG tablet Take 25 mg by mouth as needed.     carvedilol (COREG) 3.125 MG tablet Take 1 tablet (3.125 mg total) by mouth 2 (two) times daily. 180 tablet 3   No current facility-administered medications for this visit.     Allergies:   Patient has no active allergies.   Social History:  The patient  reports that he has never smoked. He has never used smokeless tobacco. He reports current alcohol use. He reports that he does not use drugs.   Family History:   family history includes Arrhythmia in his mother; Heart Problems in his father; Hypertension in his mother; Stroke in his father.    Review of Systems: Review of Systems  Constitutional: Negative.   HENT: Negative.    Respiratory: Negative.    Cardiovascular:  Positive for palpitations.  Gastrointestinal: Negative.   Musculoskeletal: Negative.   Neurological: Negative.   Psychiatric/Behavioral: Negative.    All other systems reviewed and are negative.   PHYSICAL EXAM: VS:  BP 130/70 (BP Location: Left Arm, Patient Position: Sitting, Cuff Size: Normal)   Pulse 62   Ht 5\' 8"  (1.727 m)   Wt 168 lb (76.2 kg)   SpO2 99%   BMI 25.54 kg/m  , BMI Body mass index is 25.54 kg/m. Constitutional:  oriented  to person, place, and time. No distress.  HENT:  Head: Grossly normal Eyes:  no discharge. No scleral icterus.  Neck: No JVD, no carotid bruits  Cardiovascular: Regular rate and rhythm, no murmurs appreciated Pulmonary/Chest: Clear to auscultation bilaterally, no wheezes or rails Abdominal: Soft.  no distension.  no tenderness.  Musculoskeletal: Normal range of motion Neurological:  normal muscle tone. Coordination normal. No atrophy Skin: Skin warm and dry Psychiatric: normal affect,  pleasant  Recent Labs: 11/13/2020: BUN 28; Creatinine, Ser 1.24; Potassium 4.3; Sodium 142; TSH 1.130    Lipid Panel No results found for: CHOL, HDL, LDLCALC, TRIG    Wt Readings from Last 3 Encounters:  02/11/21 168 lb (76.2 kg)  12/24/20 166 lb (75.3 kg)  11/13/20 164 lb (74.4 kg)     ASSESSMENT AND PLAN:  Problem List Items Addressed This Visit   None Visit Diagnoses     PVC (premature ventricular contraction)    -  Primary   Relevant Medications   carvedilol (COREG) 3.125 MG tablet   Other Relevant Orders   EKG 12-Lead   Palpitations       Atypical chest pain       Relevant Orders   EKG 12-Lead     PVCs Monitor reviewed Rhythm strips pulled up and discussed, he is not having significant symptoms at this time Will stay on carvedilol, could take extra if needed for breakthrough tachypalpitations No beta-blocker needed and can be taken on an as-needed basis if he is asymptomatic  Elevated blood pressure recorded Recommend he monitor blood pressure at home Carvedilol only started for elevated blood pressure in doctor's office If readings at home show well-controlled blood pressure, potentially could wean off the carvedilol if no exacerbation of his palpitations  Atypical chest pain No further work-up, provider in our office order cardiac CTA which was negative Stress reduction techniques discussed Takes care of her elderly mother-in-law with dementia   Total encounter time more than 25 minutes  Greater than 50% was spent in counseling and coordination of care with the patient    Signed, Esmond Plants, M.D., Ph.D. [place], [place]

## 2021-02-20 ENCOUNTER — Encounter: Payer: Self-pay | Admitting: Internal Medicine

## 2021-02-20 ENCOUNTER — Telehealth: Payer: Self-pay | Admitting: Internal Medicine

## 2021-02-20 ENCOUNTER — Other Ambulatory Visit: Payer: Self-pay

## 2021-02-20 ENCOUNTER — Ambulatory Visit: Payer: BC Managed Care – PPO | Admitting: Internal Medicine

## 2021-02-20 VITALS — BP 122/80 | HR 61 | Temp 97.7°F | Ht 68.03 in | Wt 168.4 lb

## 2021-02-20 DIAGNOSIS — R7989 Other specified abnormal findings of blood chemistry: Secondary | ICD-10-CM

## 2021-02-20 DIAGNOSIS — Z0001 Encounter for general adult medical examination with abnormal findings: Secondary | ICD-10-CM | POA: Diagnosis not present

## 2021-02-20 DIAGNOSIS — Z1329 Encounter for screening for other suspected endocrine disorder: Secondary | ICD-10-CM

## 2021-02-20 DIAGNOSIS — H6121 Impacted cerumen, right ear: Secondary | ICD-10-CM

## 2021-02-20 DIAGNOSIS — I10 Essential (primary) hypertension: Secondary | ICD-10-CM

## 2021-02-20 DIAGNOSIS — Z23 Encounter for immunization: Secondary | ICD-10-CM

## 2021-02-20 DIAGNOSIS — Z13220 Encounter for screening for lipoid disorders: Secondary | ICD-10-CM

## 2021-02-20 DIAGNOSIS — H6120 Impacted cerumen, unspecified ear: Secondary | ICD-10-CM

## 2021-02-20 DIAGNOSIS — Z125 Encounter for screening for malignant neoplasm of prostate: Secondary | ICD-10-CM | POA: Diagnosis not present

## 2021-02-20 DIAGNOSIS — Z1211 Encounter for screening for malignant neoplasm of colon: Secondary | ICD-10-CM

## 2021-02-20 DIAGNOSIS — I493 Ventricular premature depolarization: Secondary | ICD-10-CM | POA: Insufficient documentation

## 2021-02-20 DIAGNOSIS — E559 Vitamin D deficiency, unspecified: Secondary | ICD-10-CM

## 2021-02-20 DIAGNOSIS — Z0000 Encounter for general adult medical examination without abnormal findings: Secondary | ICD-10-CM

## 2021-02-20 DIAGNOSIS — R739 Hyperglycemia, unspecified: Secondary | ICD-10-CM

## 2021-02-20 NOTE — Patient Instructions (Addendum)
MD Physician    Primary Contact Information  Phone Fax E-mail Address  [phone] Not available Tim Reynolds   [place] [address]     Specialties     Gastroenterology             Colonoscopy, Adult A colonoscopy is a procedure to look at the entire large intestine. This procedure is done using a long, thin, flexible tube that has a camera on theend. You may have a colonoscopy: As a part of normal colorectal screening. If you have certain symptoms, such as: A low number of red blood cells in your blood (anemia). Diarrhea that does not go away. Pain in your abdomen. Blood in your stool. A colonoscopy can help screen for and diagnose medical problems, including: Tumors. Extra tissue that grows where mucus forms (polyps). Inflammation. Areas of bleeding. Tell your health care provider about: Any allergies you have. All medicines you are taking, including vitamins, herbs, eye drops, creams, and over-the-counter medicines. Any problems you or family members have had with anesthetic medicines. Any blood disorders you have. Any surgeries you have had. Any medical conditions you have. Any problems you have had with having bowel movements. Whether you are pregnant or may be pregnant. What are the risks? Generally, this is a safe procedure. However, problems may occur, including: Bleeding. Damage to your intestine. Allergic reactions to medicines given during the procedure. Infection. This is rare. What happens before the procedure? Eating and drinking restrictions Follow instructions from your health care provider about eating or drinking restrictions, which may include: A few days before the procedure: Follow a low-fiber diet. Avoid nuts, seeds, dried fruit, raw fruits, and vegetables. 1-3 days before the procedure: Eat only gelatin dessert or ice pops. Drink only clear liquids, such as water, clear juice, clear broth or bouillon, black coffee or  tea, or clear soft drinks or sports drinks. Avoid liquids that contain red or purple dye. The day of the procedure: Do not eat solid foods. You may continue to drink clear liquids until up to 2 hours before the procedure. Do not eat or drink anything starting 2 hours before the procedure, or within the time period that your health care provider recommends. Bowel prep If you were prescribed a bowel prep to take by mouth (orally) to clean out your colon: Take it as told by your health care provider. Starting the day before your procedure, you will need to drink a large amount of liquid medicine. The liquid will cause you to have many bowel movements of loose stool until your stool becomes almost clear or light green. If your skin or the opening between the buttocks (anus) gets irritated from diarrhea, you may relieve the irritation using: Wipes with medicine in them, such as adult wet wipes with aloe and vitamin E. A product to soothe skin, such as petroleum jelly. If you vomit while drinking the bowel prep: Take a break for up to 60 minutes. Begin the bowel prep again. Call your health care provider if you keep vomiting or you cannot take the bowel prep without vomiting. To clean out your colon, you may also be given: Laxative medicines. These help you have a bowel movement. Instructions for enema use. An enema is liquid medicine injected into your rectum. Medicines Ask your health care provider about: Changing or stopping your regular medicines or supplements. This is especially important if you are taking iron supplements, diabetes medicines, or blood thinners. Taking medicines  such as aspirin and ibuprofen. These medicines can thin your blood. Do not take these medicines unless your health care provider tells you to take them. Taking over-the-counter medicines, vitamins, herbs, and supplements. General instructions Ask your health care provider what steps will be taken to help prevent  infection. These may include washing skin with a germ-killing soap. Plan to have someone take you home from the hospital or clinic. What happens during the procedure?  An IV will be inserted into one of your veins. You may be given one or more of the following: A medicine to help you relax (sedative). A medicine to numb the area (local anesthetic). A medicine to make you fall asleep (general anesthetic). This is rarely needed. You will lie on your side with your knees bent. The tube will: Have oil or gel put on it (be lubricated). Be inserted into your anus. Be gently eased through all parts of your large intestine. Air will be sent into your colon to keep it open. This may cause some pressure or cramping. Images will be taken with the camera and will appear on a screen. A small tissue sample may be removed to be looked at under a microscope (biopsy). The tissue may be sent to a lab for testing if any signs of problems are found. If small polyps are found, they may be removed and checked for cancer cells. When the procedure is finished, the tube will be removed. The procedure may vary among health care providers and hospitals. What happens after the procedure? Your blood pressure, heart rate, breathing rate, and blood oxygen level will be monitored until you leave the hospital or clinic. You may have a small amount of blood in your stool. You may pass gas and have mild cramping or bloating in your abdomen. This is caused by the air that was used to open your colon during the exam. Do not drive for 24 hours after the procedure. It is up to you to get the results of your procedure. Ask your health care provider, or the department that is doing the procedure, when your results will be ready. Summary A colonoscopy is a procedure to look at the entire large intestine. Follow instructions from your health care provider about eating and drinking before the procedure. If you were prescribed an  oral bowel prep to clean out your colon, take it as told by your health care provider. During the colonoscopy, a flexible tube with a camera on its end is inserted into the anus and then passed into the other parts of the large intestine. This information is not intended to replace advice given to you by your health care provider. Make sure you discuss any questions you have with your healthcare provider. Document Revised: 02/16/2019 Document Reviewed: 02/16/2019 Elsevier Patient Education  [place].  Tdap (Tetanus, Diphtheria, Pertussis) Vaccine: What You Need to Know 1. Why get vaccinated? Tdap vaccine can prevent tetanus, diphtheria, and pertussis. Diphtheria and pertussis spread from person to person. Tetanus enters the body through cuts or wounds. TETANUS (T) causes painful stiffening of the muscles. Tetanus can lead to serious health problems, including being unable to open the mouth, having trouble swallowing and breathing, or death. DIPHTHERIA (D) can lead to difficulty breathing, heart failure, paralysis, or death. PERTUSSIS (aP), also known as "whooping cough," can cause uncontrollable, violent coughing that makes it hard to breathe, eat, or drink. Pertussis can be extremely serious especially in babies and young children, causing pneumonia, convulsions, brain  damage, or death. In teens and adults, it can cause weight loss, loss of bladder control, passing out, and rib fractures from severe coughing. 2. Tdap vaccine Tdap is only for children 7 years and older, adolescents, and adults.  Adolescents should receive a single dose of Tdap, preferably at age 1 or 48 years. Pregnant people should get a dose of Tdap during every pregnancy, preferably during the early part of the third trimester, to help protect the newborn from pertussis. Infants are most at risk for severe, life-threatening complications frompertussis. Adults who have never received Tdap should get a dose of  Tdap. Also, adults should receive a booster dose of either Tdap or Td (a different vaccine that protects against tetanus and diphtheria but not pertussis) every 10 years, or after 5 years in the case of a severe or dirty wound or burn. Tdap may be given at the same time as other vaccines. 3. Talk with your health care provider Tell your vaccine provider if the person getting the vaccine: Has had an allergic reaction after a previous dose of any vaccine that protects against tetanus, diphtheria, or pertussis, or has any severe, life-threatening allergies Has had a coma, decreased level of consciousness, or prolonged seizures within 7 days after a previous dose of any pertussis vaccine (DTP, DTaP, or Tdap) Has seizures or another nervous system problem Has ever had Guillain-Barr Syndrome (also called "GBS") Has had severe pain or swelling after a previous dose of any vaccine that protects against tetanus or diphtheria In some cases, your health care provider may decide to postpone Tdapvaccination until a future visit. People with minor illnesses, such as a cold, may be vaccinated. People who are moderately or severely ill should usually wait until they recover beforegetting Tdap vaccine.  Your health care provider can give you more information. 4. Risks of a vaccine reaction Pain, redness, or swelling where the shot was given, mild fever, headache, feeling tired, and nausea, vomiting, diarrhea, or stomachache sometimes happen after Tdap vaccination. People sometimes faint after medical procedures, including vaccination. Tellyour provider if you feel dizzy or have vision changes or ringing in the ears.  As with any medicine, there is a very remote chance of a vaccine causing asevere allergic reaction, other serious injury, or death. 5. What if there is a serious problem? An allergic reaction could occur after the vaccinated person leaves the clinic. If you see signs of a severe allergic reaction  (hives, swelling of the face and throat, difficulty breathing, a fast heartbeat, dizziness, or weakness), call 9-1-1and get the person to the nearest hospital. For other signs that concern you, call your health care provider.  Adverse reactions should be reported to the Vaccine Adverse Event Reporting System (VAERS). Your health care provider will usually file this report, or you can do it yourself. Visit the VAERS website at www.vaers.SamedayNews.es or call (217)248-8691. VAERS is only for reporting reactions, and VAERS staff members do not give medical advice. 6. The National Vaccine Injury Compensation Program The [employer] Vaccine Injury Compensation Program (VICP) is a federal program that was created to compensate people who may have been injured by certain vaccines. Claims regarding alleged injury or death due to vaccination have a time limit for filing, which may be as short as two years. Visit the VICP website at GoldCloset.com.ee or call 442-172-2199to learn about the program and about filing a claim. 7. How can I learn more? Ask your health care provider. Call your local or state health department. Visit  the website of the Food and Drug Administration (FDA) for vaccine package inserts and additional information at TraderRating.uy. Contact the Centers for Disease Control and Prevention (CDC): Call (365) 887-2248 (1-800-CDC-INFO) or Visit CDC's website at http://hunter.com/. Vaccine Information Statement Tdap (Tetanus, Diphtheria, Pertussis) Vaccine(03/14/2020) This information is not intended to replace advice given to you by your health care provider. Make sure you discuss any questions you have with your healthcare provider. Document Revised: 04/09/2020 Document Reviewed: 04/09/2020 Elsevier Patient Education  2022 [employer].

## 2021-02-20 NOTE — Progress Notes (Signed)
Chief Complaint  Patient presents with   Establish Care   New pt annual  1. Htn pvcs controlled on coreg 3.125 mg bid f/u Dr. Rockey Situ seen spring 2022  2. Tdap agreeable today  3. Refer colonoscopy never had  4. Right ear pain and hearing loss  Review of Systems  Constitutional:  Negative for weight loss.  HENT:  Positive for ear pain and hearing loss.   Eyes:  Negative for blurred vision.  Respiratory:  Negative for shortness of breath.   Cardiovascular:  Negative for chest pain.  Gastrointestinal:  Negative for abdominal pain.  Musculoskeletal:  Negative for falls and joint pain.  Skin:  Negative for rash.  Neurological:  Negative for headaches.  Psychiatric/Behavioral:  Negative for depression.   Past Medical History:  Diagnosis Date   Anxiety    Chicken pox    Depression    GERD (gastroesophageal reflux disease)    Hay fever    HTN (hypertension)    Kidney stone    10-15 years as of 02/20/21   PVC's (premature ventricular contractions)    Past Surgical History:  Procedure Laterality Date   NO PAST SURGERIES     Family History  Problem Relation Age of Onset   Hypertension Mother    Arrhythmia Mother    Hearing loss Mother    Heart Problems Father    Stroke Father        tia strokes died age 78 in 11/11/2016   Cirrhosis Father    Seizures Father    Hypertension Sister    Stroke Maternal Grandmother    Heart Problems Maternal Grandfather    Social History   Socioeconomic History   Marital status: Married    Spouse name: Not on file   Number of children: Not on file   Years of education: Not on file   Highest education level: Not on file  Occupational History   Not on file  Tobacco Use   Smoking status: Never   Smokeless tobacco: Never  Vaping Use   Vaping Use: Never used  Substance and Sexual Activity   Alcohol use: Yes    Comment: occassional   Drug use: Never   Sexual activity: Not on file  Other Topics Concern   Not on file  Social History  Narrative   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   diphenhydrAMINE (BENADRYL) 25 MG tablet Take 25 mg by mouth as needed.   No Known Allergies No results found for this or any previous visit (from the past 2158-11-12 hour(s)). Objective  Body mass index is 25.58 kg/m. Wt Readings from Last 3 Encounters:  02/20/21 168 lb 6.4 oz (76.4 kg)  02/11/21 168 lb (76.2 kg)  12/24/20 166 lb (75.3 kg)   Temp Readings from Last 3 Encounters:  02/20/21 97.7 F (36.5 C) (Oral)  07/30/19 97.9 F (36.6 C) (Oral)   BP Readings from Last 3 Encounters:  02/20/21 122/80  02/11/21 130/70  12/24/20 130/82   Pulse Readings from Last  3 Encounters:  02/20/21 61  02/11/21 62  12/24/20 66    Physical Exam Vitals and nursing note reviewed.  Constitutional:      Appearance: Normal appearance. He is well-developed and well-groomed. He is obese.  HENT:     Head: Normocephalic and atraumatic.     Right Ear: There is impacted cerumen.     Left Ear: There is no impacted cerumen.  Eyes:     Conjunctiva/sclera: Conjunctivae normal.     Pupils: Pupils are equal, round, and reactive to light.  Cardiovascular:     Rate and Rhythm: Normal rate and regular rhythm.     Heart sounds: Normal heart sounds. No murmur heard. Pulmonary:     Effort: Pulmonary effort is normal.     Breath sounds: Normal breath sounds.  Abdominal:     General: Abdomen is flat. Bowel sounds are normal.  Skin:    General: Skin is warm and dry.  Neurological:     General: No focal deficit present.     Mental  Status: He is alert and oriented to person, place, and time. Mental status is at baseline.     Gait: Gait normal.  Psychiatric:        Attention and Perception: Attention and perception normal.        Mood and Affect: Mood and affect normal.        Speech: Speech normal.        Behavior: Behavior normal. Behavior is cooperative.        Thought Content: Thought content normal.        Cognition and Memory: Cognition and memory normal.        Judgment: Judgment normal.    Assessment  Plan  Annual physical exam - Plan: Comprehensive metabolic panel, Lipid panel, CBC w/Diff, TSH done 11/2020, urine, psa, vitamin D, A1C Vaccines consider  Flu not since fall 2019  Tdap today Covid vaccines 3/3 moderna get record  Shingrix 2/2 get record  Referred colonoscopy   PVC's (premature ventricular contractions)  Elevated serum creatinine - Plan: Urinalysis, Routine w reflex microscopic  Hearing loss of right ear due to cerumen impaction  Impacted cerumen, unspecified laterality Consented and removed all wax with currette only right ear tolerated  All wax removed   Hypertension controlled on coreg 3.125 mg bid with pvcs Pvcs controlled    Cards Dr. Trula Ore [facility]   Provider: Dr. Olivia Mackie [facility]

## 2021-02-20 NOTE — Telephone Encounter (Signed)
Lft pt a vm to call ofc regarding referral [facility].thanks

## 2021-02-23 ENCOUNTER — Other Ambulatory Visit: Payer: BC Managed Care – PPO

## 2021-02-25 ENCOUNTER — Other Ambulatory Visit: Payer: Self-pay

## 2021-02-25 ENCOUNTER — Encounter: Payer: Self-pay | Admitting: Internal Medicine

## 2021-02-25 ENCOUNTER — Other Ambulatory Visit (INDEPENDENT_AMBULATORY_CARE_PROVIDER_SITE_OTHER): Payer: BC Managed Care – PPO

## 2021-02-25 DIAGNOSIS — Z13220 Encounter for screening for lipoid disorders: Secondary | ICD-10-CM

## 2021-02-25 DIAGNOSIS — R739 Hyperglycemia, unspecified: Secondary | ICD-10-CM

## 2021-02-25 DIAGNOSIS — Z0000 Encounter for general adult medical examination without abnormal findings: Secondary | ICD-10-CM

## 2021-02-25 DIAGNOSIS — R7303 Prediabetes: Secondary | ICD-10-CM | POA: Insufficient documentation

## 2021-02-25 DIAGNOSIS — E559 Vitamin D deficiency, unspecified: Secondary | ICD-10-CM

## 2021-02-25 DIAGNOSIS — E785 Hyperlipidemia, unspecified: Secondary | ICD-10-CM | POA: Insufficient documentation

## 2021-02-25 DIAGNOSIS — Z125 Encounter for screening for malignant neoplasm of prostate: Secondary | ICD-10-CM

## 2021-02-25 DIAGNOSIS — R7989 Other specified abnormal findings of blood chemistry: Secondary | ICD-10-CM

## 2021-02-25 LAB — COMPREHENSIVE METABOLIC PANEL
ALT: 15 U/L (ref 0–53)
AST: 17 U/L (ref 0–37)
Albumin: 4.1 g/dL (ref 3.5–5.2)
Alkaline Phosphatase: 61 U/L (ref 39–117)
BUN: 31 mg/dL — ABNORMAL HIGH (ref 6–23)
CO2: 20 mEq/L (ref 19–32)
Calcium: 8.9 mg/dL (ref 8.4–10.5)
Chloride: 107 mEq/L (ref 96–112)
Creatinine, Ser: 1.18 mg/dL (ref 0.40–1.50)
GFR: 65.09 mL/min (ref >60.00)
Glucose, Bld: 101 mg/dL — ABNORMAL HIGH (ref 70–99)
Potassium: 4.4 mEq/L (ref 3.5–5.1)
Sodium: 137 mEq/L (ref 135–145)
Total Bilirubin: 0.4 mg/dL (ref 0.2–1.2)
Total Protein: 6.3 g/dL (ref 6.0–8.3)

## 2021-02-25 LAB — HEMOGLOBIN A1C: Hgb A1c MFr Bld: 6 % (ref 4.6–6.5)

## 2021-02-25 LAB — VITAMIN D 25 HYDROXY (VIT D DEFICIENCY, FRACTURES): VITD: 27.64 ng/mL — ABNORMAL LOW (ref 30.00–100.00)

## 2021-02-25 LAB — CBC WITH DIFFERENTIAL/PLATELET
Basophils Absolute: 0 10*3/uL (ref 0.0–0.1)
Basophils Relative: 0.5 % (ref 0.0–3.0)
Eosinophils Absolute: 0.4 10*3/uL (ref 0.0–0.7)
Eosinophils Relative: 7.8 % — ABNORMAL HIGH (ref 0.0–5.0)
HCT: 41.4 % (ref 39.0–52.0)
Hemoglobin: 13.8 g/dL (ref 13.0–17.0)
Lymphocytes Relative: 27.4 % (ref 12.0–46.0)
Lymphs Abs: 1.4 10*3/uL (ref 0.7–4.0)
MCHC: 33.4 g/dL (ref 30.0–36.0)
MCV: 89.2 fl (ref 78.0–100.0)
Monocytes Absolute: 0.4 10*3/uL (ref 0.1–1.0)
Monocytes Relative: 7.6 % (ref 3.0–12.0)
Neutro Abs: 2.8 10*3/uL (ref 1.4–7.7)
Neutrophils Relative %: 56.7 % (ref 43.0–77.0)
Platelets: 239 10*3/uL (ref 150.0–400.0)
RBC: 4.64 Mil/uL (ref 4.22–5.81)
RDW: 13 % (ref 11.5–15.5)
WBC: 5 10*3/uL (ref 4.0–10.5)

## 2021-02-25 LAB — LIPID PANEL
Cholesterol: 276 mg/dL — ABNORMAL HIGH (ref 0–200)
HDL: 40.3 mg/dL (ref >39.00)
LDL Cholesterol: 217 mg/dL — ABNORMAL HIGH (ref 0–99)
NonHDL: 235.68
Total CHOL/HDL Ratio: 7
Triglycerides: 92 mg/dL (ref 0.0–149.0)
VLDL: 18.4 mg/dL (ref 0.0–40.0)

## 2021-02-25 LAB — PSA: PSA: 0.4 ng/mL (ref 0.10–4.00)

## 2021-02-26 LAB — URINALYSIS, ROUTINE W REFLEX MICROSCOPIC
Bilirubin Urine: NEGATIVE
Glucose, UA: NEGATIVE
Hgb urine dipstick: NEGATIVE
Ketones, ur: NEGATIVE
Leukocytes,Ua: NEGATIVE
Nitrite: NEGATIVE
Protein, ur: NEGATIVE
Specific Gravity, Urine: 1.013 (ref 1.001–1.035)
pH: 5.5 (ref 5.0–8.0)

## 2021-03-04 ENCOUNTER — Encounter: Payer: Self-pay | Admitting: Gastroenterology

## 2021-03-26 ENCOUNTER — Ambulatory Visit (AMBULATORY_SURGERY_CENTER): Payer: BC Managed Care – PPO

## 2021-03-26 ENCOUNTER — Other Ambulatory Visit: Payer: Self-pay

## 2021-03-26 VITALS — Ht 68.0 in | Wt 156.0 lb

## 2021-03-26 DIAGNOSIS — Z1211 Encounter for screening for malignant neoplasm of colon: Secondary | ICD-10-CM

## 2021-03-26 MED ORDER — GOLYTELY 236 G PO SOLR: 4000.0000 mL | ORAL | 0 refills | Status: DC

## 2021-03-26 NOTE — Progress Notes (Signed)
Pre visit completed via phone call; Patient verified name, DOB, and address;  No egg or soy allergy known to patient  No issues with past sedation with any surgeries or procedures-- no past surgeries Patient denies ever being told they had issues or difficulty with intubation - no past surgeries No FH of Malignant Hyperthermia No diet pills per patient No home 02 use per patient  No blood thinners per patient  Pt denies issues with constipation  No A fib or A flutter  EMMI video via MyChart  COVID 19 guidelines implemented in PV today with Pt and RN  Pt is fully vaccinated for Covid x 2 + booster; NO PA's for preps discussed with pt in PV today  Discussed with pt there will be an out-of-pocket cost for prep and that varies from $0 to 70 dollars  Due to the COVID-19 pandemic we are asking patients to follow certain guidelines.  Pt aware of COVID protocols and [facility] guidelines

## 2021-04-13 ENCOUNTER — Encounter: Payer: Self-pay | Admitting: Gastroenterology

## 2021-04-17 ENCOUNTER — Ambulatory Visit (AMBULATORY_SURGERY_CENTER): Payer: BC Managed Care – PPO | Admitting: Gastroenterology

## 2021-04-17 ENCOUNTER — Encounter: Payer: Self-pay | Admitting: Gastroenterology

## 2021-04-17 VITALS — BP 103/62 | HR 62 | Temp 97.5°F | Resp 16 | Ht 68.0 in | Wt 156.0 lb

## 2021-04-17 DIAGNOSIS — D122 Benign neoplasm of ascending colon: Secondary | ICD-10-CM | POA: Diagnosis not present

## 2021-04-17 DIAGNOSIS — Z1211 Encounter for screening for malignant neoplasm of colon: Secondary | ICD-10-CM | POA: Diagnosis present

## 2021-04-17 MED ORDER — SODIUM CHLORIDE 0.9 % IV SOLN: 500.0000 mL | Freq: Once | INTRAVENOUS | Status: DC

## 2021-04-17 NOTE — Progress Notes (Signed)
Pt's states no medical or surgical changes since previsit or office visit. 

## 2021-04-17 NOTE — Op Note (Signed)
[facility] Patient Name: Eugine Godman Procedure Date: 04/17/2021 3:28 PM MRN: [id number] Endoscopist: Nicki Reaper E. Candis Schatz , MD Age: 65 Referring MD:  Date of Birth: 12/23/1955 Gender: Male Account #: [id number] Procedure:                Colonoscopy Indications:              Screening for colorectal malignant neoplasm, This                            is the patient's first colonoscopy Medicines:                Monitored Anesthesia Care Procedure:                Pre-Anesthesia Assessment:                           - Prior to the procedure, a History and Physical                            was performed, and patient medications and                            allergies were reviewed. The patient's tolerance of                            previous anesthesia was also reviewed. The risks                            and benefits of the procedure and the sedation                            options and risks were discussed with the patient.                            All questions were answered, and informed consent                            was obtained. Prior Anticoagulants: The patient has                            taken no previous anticoagulant or antiplatelet                            agents. ASA Grade Assessment: II - A patient with                            mild systemic disease. After reviewing the risks                            and benefits, the patient was deemed in                            satisfactory condition to undergo the procedure.  After obtaining informed consent, the colonoscope                            was passed under direct vision. Throughout the                            procedure, the patient's blood pressure, pulse, and                            oxygen saturations were monitored continuously. The                            Olympus CF-[id number] ([id number]) Colonoscope was                            introduced through the anus and  advanced to the the                            terminal ileum, with identification of the                            appendiceal orifice and IC valve. The colonoscopy                            was performed without difficulty. The patient                            tolerated the procedure well. The quality of the                            bowel preparation was excellent. The terminal                            ileum, ileocecal valve, appendiceal orifice, and                            rectum were photographed. Scope In: 3:44:51 PM Scope Out: 4:01:02 PM Scope Withdrawal Time: 0 hours 12 minutes 42 seconds  Total Procedure Duration: 0 hours 16 minutes 11 seconds  Findings:                 The perianal and digital rectal examinations were                            normal. Pertinent negatives include normal                            sphincter tone and no palpable rectal lesions.                           A 8 mm polyp was found in the ascending colon. The                            polyp was sessile. The polyp was removed with a  cold snare. Resection and retrieval were complete.                            Estimated blood loss was minimal.                           A few small-mouthed diverticula were found in the                            sigmoid colon.                           The exam was otherwise normal throughout the                            examined colon.                           The terminal ileum appeared normal.                           The retroflexed view of the distal rectum and anal                            verge was normal and showed no anal or rectal                            abnormalities. Complications:            No immediate complications. Estimated Blood Loss:     Estimated blood loss was minimal. Impression:               - One 8 mm polyp in the ascending colon, removed                            with a cold snare. Resected and  retrieved.                           - Diverticulosis in the sigmoid colon.                           - The examined portion of the ileum was normal.                           - The distal rectum and anal verge are normal on                            retroflexion view. Recommendation:           - Patient has a contact number available for                            emergencies. The signs and symptoms of potential                            delayed complications were discussed with the  patient. Return to normal activities tomorrow.                            Written discharge instructions were provided to the                            patient.                           - Resume previous diet.                           - Continue present medications.                           - Await pathology results.                           - Repeat colonoscopy in 7 years for surveillance. Laya Letendre E. Candis Schatz, MD 04/17/2021 4:06:59 PM This report has been signed electronically.

## 2021-04-17 NOTE — Patient Instructions (Signed)
Handouts on polyps and diverticulosis given to patient. Await pathology results. Repeat colonoscopy in 7 years for surveillance.    YOU HAD AN ENDOSCOPIC PROCEDURE TODAY AT [facility]:   Refer to the procedure report that was given to you for any specific questions about what was found during the examination.  If the procedure report does not answer your questions, please call your gastroenterologist to clarify.  If you requested that your care partner not be given the details of your procedure findings, then the procedure report has been included in a sealed envelope for you to review at your convenience later.  YOU SHOULD EXPECT: Some feelings of bloating in the abdomen. Passage of more gas than usual.  Walking can help get rid of the air that was put into your GI tract during the procedure and reduce the bloating. If you had a lower endoscopy (such as a colonoscopy or flexible sigmoidoscopy) you may notice spotting of blood in your stool or on the toilet paper. If you underwent a bowel prep for your procedure, you may not have a normal bowel movement for a few days.  Please Note:  You might notice some irritation and congestion in your nose or some drainage.  This is from the oxygen used during your procedure.  There is no need for concern and it should clear up in a day or so.  SYMPTOMS TO REPORT IMMEDIATELY:  Following lower endoscopy (colonoscopy or flexible sigmoidoscopy):  Excessive amounts of blood in the stool  Significant tenderness or worsening of abdominal pains  Swelling of the abdomen that is new, acute  Fever of 100F or higher  For urgent or emergent issues, a gastroenterologist can be reached at any hour by calling [phone]. Do not use MyChart messaging for urgent concerns.    DIET:  We do recommend a small meal at first, but then you may proceed to your regular diet.  Drink plenty of fluids but you should avoid alcoholic beverages for 24  hours.  ACTIVITY:  You should plan to take it easy for the rest of today and you should NOT DRIVE or use heavy machinery until tomorrow (because of the sedation medicines used during the test).    FOLLOW UP: Our staff will call the number listed on your records 48-72 hours following your procedure to check on you and address any questions or concerns that you may have regarding the information given to you following your procedure. If we do not reach you, we will leave a message.  We will attempt to reach you two times.  During this call, we will ask if you have developed any symptoms of COVID 19. If you develop any symptoms (ie: fever, flu-like symptoms, shortness of breath, cough etc.) before then, please call [phone].  If you test positive for Covid 19 in the 2 weeks post procedure, please call and report this information to [place].    If any biopsies were taken you will be contacted by phone or by letter within the next 1-3 weeks.  Please call us at [phone] if you have not heard about the biopsies in 3 weeks.    SIGNATURES/CONFIDENTIALITY: You and/or your care partner have signed paperwork which will be entered into your electronic medical record.  These signatures attest to the fact that that the information above on your After Visit Summary has been reviewed and is understood.  Full responsibility of the confidentiality of this discharge information lies with you and/or your  care-partner.  

## 2021-04-17 NOTE — Progress Notes (Signed)
Sedate, gd SR, tolerated procedure well, VSS, report to RN 

## 2021-04-17 NOTE — Progress Notes (Signed)
[facility] History and Physical   Primary Care Physician:  McLean-Scocuzza, Nino Glow, MD   Reason for Procedure:   Colon cancer screening  Plan:    Screening colonoscopy     HPI: Tim Reynolds is a 65 y.o. male undergoing initial average risk screening colonoscopy.  He has no chronic GI symptoms and no family history of colon cancer.   Past Medical History:  Diagnosis Date   Anxiety    Chicken pox    Depression    GERD (gastroesophageal reflux disease)    OTC meds PRN-with certain foods   Hay fever    HTN (hypertension)    Hyperlipidemia    diet controlled   Kidney stone    10-15 years as of 02/20/21   PVC's (premature ventricular contractions)    Seasonal allergies     Past Surgical History:  Procedure Laterality Date   NO PAST SURGERIES      Prior to Admission medications   Medication Sig Start Date End Date Taking? Authorizing Provider  carvedilol (COREG) 3.125 MG tablet Take 1 tablet (3.125 mg total) by mouth 2 (two) times daily. Patient taking differently: Take 3.125 mg by mouth daily as needed. 02/11/21  Yes Gollan, Kathlene November, MD  Cholecalciferol (VITAMIN D3 PO) Take 1 tablet by mouth daily at 6 (six) AM.   Yes [provider]  diphenhydrAMINE (BENADRYL) 25 MG tablet Take 25 mg by mouth as needed.    [provider]    Current Outpatient Medications  Medication Sig Dispense Refill   carvedilol (COREG) 3.125 MG tablet Take 1 tablet (3.125 mg total) by mouth 2 (two) times daily. (Patient taking differently: Take 3.125 mg by mouth daily as needed.) 180 tablet 3   Cholecalciferol (VITAMIN D3 PO) Take 1 tablet by mouth daily at 6 (six) AM.     diphenhydrAMINE (BENADRYL) 25 MG tablet Take 25 mg by mouth as needed.     Current Facility-Administered Medications  Medication Dose Route Frequency Provider Last Rate Last Admin   0.9 %  sodium chloride infusion  500 mL Intravenous Once Daryel November, MD        Allergies as of  04/17/2021   (No Known Allergies)    Family History  Problem Relation Age of Onset   Hypertension Mother    Arrhythmia Mother    Hearing loss Mother    Heart Problems Father    Stroke Father        tia strokes died age 7 in Nov 03, 2016   Cirrhosis Father    Seizures Father    Hypertension Sister    Stroke Maternal Grandmother    Heart Problems Maternal Grandfather    Colon polyps Neg Hx    Colon cancer Neg Hx    Esophageal cancer Neg Hx    Rectal cancer Neg Hx    Stomach cancer Neg Hx     Social History   Socioeconomic History   Marital status: Married    Spouse name: Not on file   Number of children: Not on file   Years of education: Not on file   Highest education level: Not on file  Occupational History   Not on file  Tobacco Use   Smoking status: Never   Smokeless tobacco: Never  Vaping Use   Vaping Use: Never used  Substance and Sexual Activity   Alcohol use: Yes    Comment: occassional   Drug use: Never   Sexual activity: Never  Other Topics Concern  Not on file  Social History Narrative   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  Physical Exam: Vital signs BP (!) 119/58   Pulse 75   Temp (!) 97.5 F (36.4 C) (Temporal)   Ht '5\' 8"'$  (1.727 m)   Wt 156 lb (70.8 kg)   SpO2 97%   BMI 23.72 kg/m   General:   Alert,  Well-developed, well-nourished, pleasant and cooperative in NAD, MP1 Lungs:  Clear throughout to auscultation.   Heart:  Regular rate and rhythm; no murmurs, clicks, rubs,  or gallops. Abdomen:  Soft, nontender and nondistended. Normal bowel sounds.    Neuro/Psych:   Normal mood and affect. A and O x 3  [name] E. Candis Schatz, MD [facility]

## 2021-04-17 NOTE — Progress Notes (Signed)
Called to room to assist during endoscopic procedure.  Patient ID and intended procedure confirmed with present staff. Received instructions for my participation in the procedure from the performing physician.  

## 2021-04-21 ENCOUNTER — Telehealth: Payer: Self-pay

## 2021-04-21 NOTE — Telephone Encounter (Signed)
  Follow up Call-  Call back number 04/17/2021  Post procedure Call Back phone  # [phone]  Permission to leave phone message Yes  Some recent data might be hidden     Patient questions:  Do you have a fever, pain , or abdominal swelling? No. Pain Score  0 *  Have you tolerated food without any problems? Yes.    Have you been able to return to your normal activities? Yes.    Do you have any questions about your discharge instructions: Diet   No. Medications  No. Follow up visit  No.  Do you have questions or concerns about your Care? No.  Actions: * If pain score is 4 or above: No action needed, pain <4.  Have you developed a fever since your procedure? no  2.   Have you had an respiratory symptoms (SOB or cough) since your procedure? no  3.   Have you tested positive for COVID 19 since your procedure no  4.   Have you had any family members/close contacts diagnosed with the COVID 19 since your procedure?  no   If yes to any of these questions please route to Joylene John, RN and Joella Prince, RN

## 2021-04-29 ENCOUNTER — Encounter: Payer: Self-pay | Admitting: Gastroenterology

## 2021-08-25 ENCOUNTER — Ambulatory Visit: Payer: BC Managed Care – PPO | Admitting: Internal Medicine

## 2021-08-25 ENCOUNTER — Encounter: Payer: Self-pay | Admitting: Internal Medicine

## 2021-08-25 ENCOUNTER — Other Ambulatory Visit: Payer: Self-pay

## 2021-08-25 VITALS — BP 130/80 | HR 59 | Temp 96.6°F | Ht 68.0 in | Wt 173.4 lb

## 2021-08-25 DIAGNOSIS — Z23 Encounter for immunization: Secondary | ICD-10-CM | POA: Diagnosis not present

## 2021-08-25 DIAGNOSIS — I10 Essential (primary) hypertension: Secondary | ICD-10-CM

## 2021-08-25 DIAGNOSIS — R799 Abnormal finding of blood chemistry, unspecified: Secondary | ICD-10-CM

## 2021-08-25 DIAGNOSIS — E559 Vitamin D deficiency, unspecified: Secondary | ICD-10-CM

## 2021-08-25 DIAGNOSIS — I493 Ventricular premature depolarization: Secondary | ICD-10-CM

## 2021-08-25 DIAGNOSIS — R7303 Prediabetes: Secondary | ICD-10-CM

## 2021-08-25 DIAGNOSIS — E785 Hyperlipidemia, unspecified: Secondary | ICD-10-CM | POA: Diagnosis not present

## 2021-08-25 MED ORDER — PNEUMOCOCCAL 13-VAL CONJ VACC IM SUSP: 0.5000 mL | Freq: Once | INTRAMUSCULAR | 0 refills | Status: AC

## 2021-08-25 NOTE — Patient Instructions (Addendum)
Think about prevnar 13 vaccine Pneumococcal Conjugate Vaccine (Prevnar 13) Suspension for Injection What is this medication? PNEUMOCOCCAL VACCINE (NEU mo KOK al vak SEEN) is a vaccine used to prevent pneumococcus bacterial infections. These bacteria can cause serious infections like pneumonia, meningitis, and blood infections. This vaccine will lower your chance of getting pneumonia. If you do get pneumonia, it can make your symptoms milder and your illness shorter. This vaccine will not treat an infection and will not cause infection. This vaccine is recommended for infants and young children, adults with certain medical conditions, and adults 43 years or older. This medicine may be used for other purposes; ask your health care provider or pharmacist if you have questions. COMMON BRAND NAME(S): Prevnar, Prevnar 13 What should I tell my care team before I take this medication? They need to know if you have any of these conditions: bleeding problems fever immune system problems an unusual or allergic reaction to pneumococcal vaccine, diphtheria toxoid, other vaccines, latex, other medicines, foods, dyes, or preservatives pregnant or trying to get pregnant breast-feeding How should I use this medication? This vaccine is for injection into a muscle. It is given by a health care professional. A copy of Vaccine Information Statements will be given before each vaccination. Read this sheet carefully each time. The sheet may change frequently. Talk to your pediatrician regarding the use of this medicine in children. While this drug may be prescribed for children as young as 36 weeks old for selected conditions, precautions do apply. Overdosage: If you think you have taken too much of this medicine contact a poison control center or emergency room at once. NOTE: This medicine is only for you. Do not share this medicine with others. What if I miss a dose? It is important not to miss your dose. Call your  doctor or health care professional if you are unable to keep an appointment. What may interact with this medication? medicines for cancer chemotherapy medicines that suppress your immune function steroid medicines like prednisone or cortisone This list may not describe all possible interactions. Give your health care provider a list of all the medicines, herbs, non-prescription drugs, or dietary supplements you use. Also tell them if you smoke, drink alcohol, or use illegal drugs. Some items may interact with your medicine. What should I watch for while using this medication? Mild fever and pain should go away in 3 days or less. Report any unusual symptoms to your doctor or health care professional. What side effects may I notice from receiving this medication? Side effects that you should report to your doctor or health care professional as soon as possible: allergic reactions like skin rash, itching or hives, swelling of the face, lips, or tongue breathing problems confused fast or irregular heartbeat fever over 102 degrees F seizures unusual bleeding or bruising unusual muscle weakness Side effects that usually do not require medical attention (report to your doctor or health care professional if they continue or are bothersome): aches and pains diarrhea fever of 102 degrees F or less headache irritable loss of appetite pain, tender at site where injected trouble sleeping This list may not describe all possible side effects. Call your doctor for medical advice about side effects. You may report side effects to FDA at 1-800-FDA-1088. Where should I keep my medication? This does not apply. This vaccine is given in a clinic, pharmacy, doctor's office, or other health care setting and will not be stored at home. NOTE: This sheet is a summary. It  may not cover all possible information. If you have questions about this medicine, talk to your doctor, pharmacist, or health care provider.   2022 Elsevier/Gold Standard (2014-05-02 00:00:00) Premature Ventricular Contraction A premature ventricular contraction (PVC) is a common kind of irregular heartbeat (arrhythmia). These contractions are extra heartbeats that start in the ventricles of the heart and occur too early in the normal sequence. During the PVC, the heart's normal electrical pathway is not used, so the beat is shorter and less effective. In most cases, these contractions come and go and do not require treatment. What are the causes? Common causes of the condition include: Smoking. Drinking alcohol. Certain medicines. Some illegal drugs. Stress. Caffeine. Certain medical conditions can also cause PVCs: Heart failure. Heart attack, or coronary artery disease. Heart valve problems. Changes in minerals in the blood (electrolytes). Low blood oxygen levels or high carbon dioxide levels. In many cases, the cause of this condition is not known. What are the signs or symptoms? The main symptom of this condition is fast or skipped heartbeats (palpitations). Other symptoms include: Chest pain. Shortness of breath. Feeling tired. Dizziness. Difficulty exercising. In some cases, there are no symptoms. How is this diagnosed? This condition may be diagnosed based on: Your medical history. A physical exam. During the exam, the health care provider will check for irregular heartbeats. Tests, such as: An ECG (electrocardiogram) to monitor the electrical activity of your heart. An ambulatory cardiac monitor. This device records your heartbeats for 24 hours or more. Stress tests to see how exercise affects your heart rhythm and blood supply. An echocardiogram. This test uses sound waves (ultrasound) to produce an image of your heart. An electrophysiology study (EPS). This test checks for electrical problems in your heart. How is this treated? Treatment for this condition depends on any underlying conditions, the type of PVCs  that you are having, and how much the symptoms are interfering with your daily life. Possible treatments include: Avoiding things that cause premature contractions (triggers). These include caffeine and alcohol. Taking medicines if symptoms are severe or if the extra heartbeats are frequent. Getting treatment for underlying conditions that cause PVCs. Having an implantable cardioverter defibrillator (ICD), if you are at risk for a serious arrhythmia. The ICD is a small device that is inserted into your chest to monitor your heartbeat. When it senses an irregular heartbeat, it sends a shock to bring the heartbeat back to normal. Having a procedure to destroy the portion of the heart tissue that sends out abnormal signals (catheter ablation). In some cases, no treatment is required. Follow these instructions at home: Lifestyle Do not use any products that contain nicotine or tobacco, such as cigarettes, e-cigarettes, and chewing tobacco. If you need help quitting, ask your health care provider. Do not use illegal drugs. Exercise regularly. Ask your health care provider what type of exercise is safe for you. Try to get at least 7-9 hours of sleep each night, or as much as recommended by your health care provider. Find healthy ways to manage stress. Avoid stressful situations when possible. Alcohol use Do not drink alcohol if: Your health care provider tells you not to drink. You are pregnant, may be pregnant, or are planning to become pregnant. Alcohol triggers your episodes. If you drink alcohol: Limit how much you use to: 0-1 drink a day for women. 0-2 drinks a day for men. Be aware of how much alcohol is in your drink. In the [place] one drink equals one 12 oz bottle  of beer (355 mL), one 5 oz glass of wine (148 mL), or one 1 oz glass of hard liquor (44 mL). General instructions Take over-the-counter and prescription medicines only as told by your health care provider. If caffeine triggers  episodes of PVC, do not eat, drink, or use anything with caffeine in it. Keep all follow-up visits as told by your health care provider. This is important. Contact a health care provider if you: Feel palpitations. Get help right away if you: Have chest pain. Have shortness of breath. Have sweating for no reason. Have nausea and vomiting. Become light-headed or you faint. Summary A premature ventricular contraction (PVC) is a common kind of irregular heartbeat (arrhythmia). In most cases, these contractions come and go and do not require treatment. You may need to wear an ambulatory cardiac monitor. This records your heartbeats for 24 hours or more. Treatment depends on any underlying conditions, the type of PVCs that you are having, and how much the symptoms are interfering with your daily life. This information is not intended to replace advice given to you by your health care provider. Make sure you discuss any questions you have with your health care provider. Document Revised: 12/23/2020 Document Reviewed: 12/23/2020 Elsevier Patient Education  [place].

## 2021-08-25 NOTE — Progress Notes (Signed)
Chief Complaint  Patient presents with   Follow-up   F/u  1. Pvcs not having on coreg 3.125 mg qd to bid and htn no sure if has an issue with this as was checking BP of coreg and bp normal at goal <103/<80 no sx's of pvcs currently 2. Elevated bun drinking 40-80 oz at night and 80 oz at school and peeing 2x per night but no bph sxs denies nsaid use. He is urinating ok  3. Hld repeat labs upcoming he was doing keto diet in the past   Review of Systems  Constitutional:  Negative for weight loss.  HENT:  Negative for hearing loss.   Eyes:  Negative for blurred vision.  Respiratory:  Negative for shortness of breath.   Cardiovascular:  Negative for chest pain.  Gastrointestinal:  Negative for abdominal pain and blood in stool.  Musculoskeletal:  Negative for back pain.  Skin:  Negative for rash.  Neurological:  Negative for headaches.  Psychiatric/Behavioral:  Negative for depression.   Past Medical History:  Diagnosis Date   Anxiety    Chicken pox    Depression    GERD (gastroesophageal reflux disease)    OTC meds PRN-with certain foods   Hay fever    HTN (hypertension)    Hyperlipidemia    diet controlled   Kidney stone    10-15 years as of 02/20/21   PVC's (premature ventricular contractions)    Seasonal allergies    Past Surgical History:  Procedure Laterality Date   NO PAST SURGERIES     Family History  Problem Relation Age of Onset   Hypertension Mother    Arrhythmia Mother    Hearing loss Mother    Heart Problems Father    Stroke Father        tia strokes died age 47 in Nov 06, 2016   Cirrhosis Father    Seizures Father    Hypertension Sister    Stroke Maternal Grandmother    Heart Problems Maternal Grandfather    Colon polyps Neg Hx    Colon cancer Neg Hx    Esophageal cancer Neg Hx    Rectal cancer Neg Hx    Stomach cancer Neg Hx    Social History   Socioeconomic History   Marital status: Married    Spouse name: Not on file   Number of children: Not on file    Years of education: Not on file   Highest education level: Not on file  Occupational History   Not on file  Tobacco Use   Smoking status: Never   Smokeless tobacco: Never  Vaping Use   Vaping Use: Never used  Substance and Sexual Activity   Alcohol use: Yes    Comment: occassional   Drug use: Never   Sexual activity: Never  Other Topics Concern   Not on file  Social History Narrative   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 (Patient taking differently: Take 3.125 mg by mouth daily as needed.)   Cholecalciferol (VITAMIN  D3 PO) Take 1 tablet by mouth daily at 6 (six) AM.   pneumococcal 13-valent conjugate vaccine (PREVNAR 13) SUSP injection Inject 0.5 mLs into the muscle once for 1 dose.   Allergies  Allergen Reactions   Other Nausea And Vomiting    Duck Eggs   No results found for this or any previous visit (from the past 2160 hour(s)). Objective  Body mass index is 26.37 kg/m. Wt Readings from Last 3 Encounters:  08/25/21 173 lb 6.4 oz (78.7 kg)  04/17/21 156 lb (70.8 kg)  03/26/21 156 lb (70.8 kg)   Temp Readings from Last 3 Encounters:  08/25/21 (!) 96.6 F (35.9 C) (Temporal)  04/17/21 (!) 97.5 F (36.4 C) (Temporal)  02/20/21 97.7 F (36.5 C) (Oral)   BP Readings from Last 3 Encounters:  08/25/21 130/80  04/17/21 103/62  02/20/21 122/80   Pulse Readings from Last 3 Encounters:  08/25/21 (!) 59  04/17/21 62  02/20/21 61    Physical Exam Vitals and nursing note reviewed.  Constitutional:      Appearance: Normal appearance. He  is well-developed and well-groomed. He is obese.  HENT:     Head: Normocephalic and atraumatic.  Eyes:     Conjunctiva/sclera: Conjunctivae normal.     Pupils: Pupils are equal, round, and reactive to light.  Cardiovascular:     Rate and Rhythm: Normal rate and regular rhythm.     Heart sounds: Normal heart sounds.  Pulmonary:     Effort: Pulmonary effort is normal. No respiratory distress.     Breath sounds: Normal breath sounds.  Abdominal:     Tenderness: There is no abdominal tenderness.  Skin:    General: Skin is warm and moist.  Neurological:     General: No focal deficit present.     Mental Status: He is alert and oriented to person, place, and time. Mental status is at baseline.     Sensory: Sensation is intact.     Motor: Motor function is intact.     Coordination: Coordination is intact.     Gait: Gait is intact. Gait normal.  Psychiatric:        Attention and Perception: Attention and perception normal.        Mood and Affect: Mood and affect normal.        Speech: Speech normal.        Behavior: Behavior normal. Behavior is cooperative.        Thought Content: Thought content normal.        Cognition and Memory: Cognition and memory normal.        Judgment: Judgment normal.    Assessment  Plan  PVCs w/o sxs coronary calcium score 12/2020 negative/normal Hypertension On coreg 3.125 mg bid taking qd  Hld repeat labs ct calcium score negative 12/2020   Elevated BUN - Plan: Basic Metabolic Panel (BMET), Microalbumin / creatinine urine ratio, Sodium, urine, random  Vitamin D deficiency - Plan: Vitamin D (25 hydroxy)  Prediabetes - Plan: Hemoglobin A1c  HM Flu 08/25/21  Tdap 02/20/21  Covid vaccines 3/3 moderna get record  Shingrix 2/2 get record [facility] Given Rx prevnar today   Colonoscopy tubular adenoma 04/17/21 f/u in 7 years    PVC's (premature ventricular contractions) Cards Dr. Rockey Situ  [facility] healthy diet and exercise   Provider: Dr.  Olivia Mackie  [facility]

## 2021-08-27 ENCOUNTER — Telehealth: Payer: Self-pay | Admitting: *Deleted

## 2021-08-27 NOTE — Addendum Note (Signed)
Addended by: Orland Mustard on: 08/27/2021 04:32 PM   Modules accepted: Orders

## 2021-08-27 NOTE — Telephone Encounter (Signed)
Hi  Why were labs rescheduled? Orders were in 08/25/21 but if you can do cbc too as well form 08/27/21   Thank you

## 2021-08-27 NOTE — Telephone Encounter (Signed)
Please place future orders for lab appt.    Scheduling Notes: fasting lab  Rescheduled: 08/25/2021 4:12 PM By: Si Raider

## 2021-08-28 NOTE — Telephone Encounter (Signed)
Patient set to come in 09/02/21. Noted

## 2021-09-02 ENCOUNTER — Other Ambulatory Visit (INDEPENDENT_AMBULATORY_CARE_PROVIDER_SITE_OTHER): Payer: BC Managed Care – PPO

## 2021-09-02 ENCOUNTER — Other Ambulatory Visit: Payer: Self-pay

## 2021-09-02 DIAGNOSIS — R799 Abnormal finding of blood chemistry, unspecified: Secondary | ICD-10-CM | POA: Diagnosis not present

## 2021-09-02 DIAGNOSIS — I10 Essential (primary) hypertension: Secondary | ICD-10-CM

## 2021-09-02 LAB — CBC WITH DIFFERENTIAL/PLATELET
Basophils Absolute: 0 10*3/uL (ref 0.0–0.1)
Basophils Relative: 0.5 % (ref 0.0–3.0)
Eosinophils Absolute: 0.3 10*3/uL (ref 0.0–0.7)
Eosinophils Relative: 4.4 % (ref 0.0–5.0)
HCT: 44.8 % (ref 39.0–52.0)
Hemoglobin: 14.9 g/dL (ref 13.0–17.0)
Lymphocytes Relative: 27.8 % (ref 12.0–46.0)
Lymphs Abs: 1.7 10*3/uL (ref 0.7–4.0)
MCHC: 33.2 g/dL (ref 30.0–36.0)
MCV: 87.9 fl (ref 78.0–100.0)
Monocytes Absolute: 0.5 10*3/uL (ref 0.1–1.0)
Monocytes Relative: 7.9 % (ref 3.0–12.0)
Neutro Abs: 3.7 10*3/uL (ref 1.4–7.7)
Neutrophils Relative %: 59.4 % (ref 43.0–77.0)
Platelets: 249 10*3/uL (ref 150.0–400.0)
RBC: 5.1 Mil/uL (ref 4.22–5.81)
RDW: 13.1 % (ref 11.5–15.5)
WBC: 6.3 10*3/uL (ref 4.0–10.5)

## 2021-09-02 NOTE — Addendum Note (Signed)
Addended by: [name] on: 09/02/2021 04:17 PM   Modules accepted: Orders

## 2021-09-03 ENCOUNTER — Other Ambulatory Visit: Payer: BC Managed Care – PPO

## 2021-09-03 ENCOUNTER — Telehealth: Payer: Self-pay | Admitting: Internal Medicine

## 2021-09-03 ENCOUNTER — Other Ambulatory Visit (INDEPENDENT_AMBULATORY_CARE_PROVIDER_SITE_OTHER): Payer: BC Managed Care – PPO

## 2021-09-03 DIAGNOSIS — R7303 Prediabetes: Secondary | ICD-10-CM | POA: Diagnosis not present

## 2021-09-03 LAB — HEMOGLOBIN A1C: Hgb A1c MFr Bld: 6.1 % (ref 4.6–6.5)

## 2021-09-03 NOTE — Telephone Encounter (Signed)
[name], CMA  09/03/2021  1:38 PM EST Back to Top    Patient seen results on mychart.    Left message to return call and schedule lab appointment.    [name], CMA  09/03/2021 12:59 PM EST     Seen by patient Tim Reynolds on 09/03/2021 11:53 AM   Nino Glow McLean-Scocuzza, MD  09/03/2021 11:28 AM EST     +prediabetes A1c 6.1  Blood cts normal   Sorry Mr. Derise the lab could only do these 2 labs there was a computer error and other labs ive ordered need to be re-drawn fasting labs if you could please call to reschedule fasting labs and urine   Lab appointment note needs to state "No draw fee"

## 2022-02-23 ENCOUNTER — Ambulatory Visit: Payer: BC Managed Care – PPO | Admitting: Internal Medicine

## 2022-02-23 ENCOUNTER — Encounter: Payer: Self-pay | Admitting: Internal Medicine

## 2022-02-23 VITALS — BP 130/82 | HR 59 | Temp 97.9°F | Ht 68.0 in | Wt 170.8 lb

## 2022-02-23 DIAGNOSIS — I493 Ventricular premature depolarization: Secondary | ICD-10-CM

## 2022-02-23 DIAGNOSIS — Z1329 Encounter for screening for other suspected endocrine disorder: Secondary | ICD-10-CM | POA: Diagnosis not present

## 2022-02-23 DIAGNOSIS — I10 Essential (primary) hypertension: Secondary | ICD-10-CM

## 2022-02-23 DIAGNOSIS — E538 Deficiency of other specified B group vitamins: Secondary | ICD-10-CM | POA: Diagnosis not present

## 2022-02-23 DIAGNOSIS — E559 Vitamin D deficiency, unspecified: Secondary | ICD-10-CM | POA: Diagnosis not present

## 2022-02-23 DIAGNOSIS — R799 Abnormal finding of blood chemistry, unspecified: Secondary | ICD-10-CM

## 2022-02-23 DIAGNOSIS — Z1389 Encounter for screening for other disorder: Secondary | ICD-10-CM

## 2022-02-23 DIAGNOSIS — Z1283 Encounter for screening for malignant neoplasm of skin: Secondary | ICD-10-CM

## 2022-02-23 DIAGNOSIS — Z0000 Encounter for general adult medical examination without abnormal findings: Secondary | ICD-10-CM

## 2022-02-23 DIAGNOSIS — Z23 Encounter for immunization: Secondary | ICD-10-CM | POA: Diagnosis not present

## 2022-02-23 DIAGNOSIS — Z125 Encounter for screening for malignant neoplasm of prostate: Secondary | ICD-10-CM | POA: Diagnosis not present

## 2022-02-23 DIAGNOSIS — N179 Acute kidney failure, unspecified: Secondary | ICD-10-CM

## 2022-02-23 DIAGNOSIS — R7303 Prediabetes: Secondary | ICD-10-CM | POA: Diagnosis not present

## 2022-02-23 DIAGNOSIS — E785 Hyperlipidemia, unspecified: Secondary | ICD-10-CM

## 2022-02-23 LAB — CBC WITH DIFFERENTIAL/PLATELET
Basophils Absolute: 0 10*3/uL (ref 0.0–0.1)
Basophils Relative: 0.4 % (ref 0.0–3.0)
Eosinophils Absolute: 0.1 10*3/uL (ref 0.0–0.7)
Eosinophils Relative: 2.8 % (ref 0.0–5.0)
HCT: 42.5 % (ref 39.0–52.0)
Hemoglobin: 14.2 g/dL (ref 13.0–17.0)
Lymphocytes Relative: 29.9 % (ref 12.0–46.0)
Lymphs Abs: 1.3 10*3/uL (ref 0.7–4.0)
MCHC: 33.4 g/dL (ref 30.0–36.0)
MCV: 89 fl (ref 78.0–100.0)
Monocytes Absolute: 0.4 10*3/uL (ref 0.1–1.0)
Monocytes Relative: 8.8 % (ref 3.0–12.0)
Neutro Abs: 2.6 10*3/uL (ref 1.4–7.7)
Neutrophils Relative %: 58.1 % (ref 43.0–77.0)
Platelets: 232 10*3/uL (ref 150.0–400.0)
RBC: 4.78 Mil/uL (ref 4.22–5.81)
RDW: 13.1 % (ref 11.5–15.5)
WBC: 4.5 10*3/uL (ref 4.0–10.5)

## 2022-02-23 LAB — LIPID PANEL
Cholesterol: 246 mg/dL — ABNORMAL HIGH (ref 0–200)
HDL: 43.1 mg/dL (ref >39.00)
LDL Cholesterol: 189 mg/dL — ABNORMAL HIGH (ref 0–99)
NonHDL: 202.99
Total CHOL/HDL Ratio: 6
Triglycerides: 69 mg/dL (ref 0.0–149.0)
VLDL: 13.8 mg/dL (ref 0.0–40.0)

## 2022-02-23 LAB — COMPREHENSIVE METABOLIC PANEL
ALT: 18 U/L (ref 0–53)
AST: 20 U/L (ref 0–37)
Albumin: 4.4 g/dL (ref 3.5–5.2)
Alkaline Phosphatase: 66 U/L (ref 39–117)
BUN: 23 mg/dL (ref 6–23)
CO2: 26 mEq/L (ref 19–32)
Calcium: 9.1 mg/dL (ref 8.4–10.5)
Chloride: 105 mEq/L (ref 96–112)
Creatinine, Ser: 1.24 mg/dL (ref 0.40–1.50)
GFR: 60.9 mL/min (ref >60.00)
Glucose, Bld: 96 mg/dL (ref 70–99)
Potassium: 4.6 mEq/L (ref 3.5–5.1)
Sodium: 138 mEq/L (ref 135–145)
Total Bilirubin: 0.4 mg/dL (ref 0.2–1.2)
Total Protein: 6.6 g/dL (ref 6.0–8.3)

## 2022-02-23 LAB — TSH: TSH: 1.64 u[IU]/mL (ref 0.35–5.50)

## 2022-02-23 LAB — VITAMIN B12: Vitamin B-12: 299 pg/mL (ref 211–911)

## 2022-02-23 LAB — VITAMIN D 25 HYDROXY (VIT D DEFICIENCY, FRACTURES): VITD: 62.36 ng/mL (ref 30.00–100.00)

## 2022-02-23 LAB — HEMOGLOBIN A1C: Hgb A1c MFr Bld: 6.1 % (ref 4.6–6.5)

## 2022-02-23 LAB — PSA: PSA: 0.34 ng/mL (ref 0.10–4.00)

## 2022-02-23 MED ORDER — CARVEDILOL 3.125 MG PO TABS: 3.1250 mg | ORAL_TABLET | Freq: Two times a day (BID) | ORAL | 3 refills | Status: DC

## 2022-02-23 NOTE — Progress Notes (Signed)
Chief Complaint  Patient presents with   Follow-up    6 month f/u with no other concerns   Annual Exam   Annual  1. Pvcs/htn controlled on coreg 3.125 mg bid doing well     Review of Systems  Constitutional:  Negative for weight loss.  HENT:  Negative for hearing loss.   Eyes:  Negative for blurred vision.  Respiratory:  Negative for shortness of breath.   Cardiovascular:  Negative for chest pain.  Gastrointestinal:  Negative for abdominal pain and blood in stool.  Musculoskeletal:  Negative for back pain.  Skin:  Negative for rash.  Neurological:  Negative for headaches.  Psychiatric/Behavioral:  Negative for depression.    Past Medical History:  Diagnosis Date   Anxiety    Chicken pox    Depression    GERD (gastroesophageal reflux disease)    OTC meds PRN-with certain foods   Hay fever    HTN (hypertension)    Hyperlipidemia    diet controlled   Kidney stone    10-15 years as of 02/20/21   PVC's (premature ventricular contractions)    Seasonal allergies    Past Surgical History:  Procedure Laterality Date   NO PAST SURGERIES     Family History  Problem Relation Age of Onset   Hypertension Mother    Arrhythmia Mother    Hearing loss Mother    Heart Problems Father    Stroke Father        tia strokes died age 41 in 10/22/2016   Cirrhosis Father    Seizures Father    Hypertension Sister    Stroke Maternal Grandmother    Heart Problems Maternal Grandfather    Colon polyps Neg Hx    Colon cancer Neg Hx    Esophageal cancer Neg Hx    Rectal cancer Neg Hx    Stomach cancer Neg Hx    Social History   Socioeconomic History   Marital status: Married    Spouse name: Not on file   Number of children: Not on file   Years of education: Not on file   Highest education level: Not on file  Occupational History   Not on file  Tobacco Use   Smoking status: Never   Smokeless tobacco: Never  Vaping Use   Vaping Use: Never used  Substance and Sexual Activity    Alcohol use: Yes    Comment: occassional   Drug use: Never   Sexual activity: Never  Other Topics Concern   Not on file  Social History Narrative   BS degree teacher AP biology teacher [employer]   Married Chiropodist    Social Determinants of Health   Financial Resource Strain: Not on file  Food Insecurity: Not on file  Transportation Needs: Not on file  Physical Activity: Not on file  Stress: Not on file  Social Connections: Not on file  Intimate Partner Violence: Not on file   Current Meds  Medication Sig   diphenhydrAMINE (BENADRYL) 25 MG tablet Take 25 mg by mouth as needed.   [DISCONTINUED] carvedilol (COREG) 3.125 MG tablet Take 1 tablet (3.125 mg total) by mouth 2 (two) times daily. (Patient taking differently: Take 3.125 mg by mouth daily as needed.)   Allergies  Allergen Reactions   Other Nausea And Vomiting    Duck Eggs   No results found for this or any previous visit (from the past 10/23/2158 hour(s)). Objective  Body mass index is 25.97 kg/m. Wt Readings  from Last 3 Encounters:  02/23/22 170 lb 12.8 oz (77.5 kg)  08/25/21 173 lb 6.4 oz (78.7 kg)  04/17/21 156 lb (70.8 kg)   Temp Readings from Last 3 Encounters:  02/23/22 97.9 F (36.6 C) (Oral)  08/25/21 (!) 96.6 F (35.9 C) (Temporal)  04/17/21 (!) 97.5 F (36.4 C) (Temporal)   BP Readings from Last 3 Encounters:  02/23/22 130/82  08/25/21 130/80  04/17/21 103/62   Pulse Readings from Last 3 Encounters:  02/23/22 (!) 59  08/25/21 (!) 59  04/17/21 62    Physical Exam Vitals and nursing note reviewed.  Constitutional:      Appearance: Normal appearance. He is well-developed and well-groomed.  HENT:     Head: Normocephalic and atraumatic.  Eyes:     Conjunctiva/sclera: Conjunctivae normal.     Pupils: Pupils are equal, round, and reactive to light.  Cardiovascular:     Rate and Rhythm: Normal rate and regular rhythm.     Heart sounds: Normal heart sounds.  Pulmonary:     Effort: Pulmonary  effort is normal. No respiratory distress.     Breath sounds: Normal breath sounds.  Abdominal:     Tenderness: There is no abdominal tenderness.  Skin:    General: Skin is warm and moist.  Neurological:     General: No focal deficit present.     Mental Status: He is alert and oriented to person, place, and time. Mental status is at baseline.     Sensory: Sensation is intact.     Motor: Motor function is intact.     Coordination: Coordination is intact.     Gait: Gait is intact. Gait normal.  Psychiatric:        Attention and Perception: Attention and perception normal.        Mood and Affect: Mood and affect normal.        Speech: Speech normal.        Behavior: Behavior normal. Behavior is cooperative.        Thought Content: Thought content normal.        Cognition and Memory: Cognition and memory normal.        Judgment: Judgment normal.     Assessment  Plan  Annual physical exam See below   PVC's (premature ventricular contractions) - Plan: carvedilol (COREG) 3.125 MG tablet bid  Hypertension,controlled - Plan: carvedilol (COREG) 3.125 MG tablet, Comprehensive metabolic panel, Lipid panel, CBC with Differential/Platelet   Elevated BUN - Plan: Microalbumin / creatinine urine ratio, Sodium, urine, random, Protein Electrophoresis, (serum), Protein Electrophoresis, Urine Rflx.  AKI (acute kidney injury) ([place]) - Plan: Protein Electrophoresis, (serum), Protein Electrophoresis, Urine Rflx.   HM Flu 08/25/21  Tdap 02/20/21  Covid vaccines 3/3 moderna get record  Shingrix 2/2 get record [facility] Prevnar 20 x1 vaccines complete    Colonoscopy tubular adenoma 04/17/21 f/u in 7 years   Hep C declines  PVC's (premature ventricular contractions) Cards Dr. Rockey Situ  Eye [place] eye PSA pending today   Rec healthy diet and exercise    Provider: Dr. Olivia Mackie McLean-Scocuzza-Internal Medicine

## 2022-02-23 NOTE — Patient Instructions (Signed)
Prevnar 20  Pneumococcal Conjugate Vaccine (Prevnar 20) Suspension for Injection What is this medication? PNEUMOCOCCAL VACCINE (NEU mo KOK al vak SEEN) is a vaccine. It prevents pneumococcus bacterial infections. These bacteria can cause serious infections like pneumonia, meningitis, and blood infections. This vaccine will not treat an infection and will not cause infection. This vaccine is recommended for adults 18 years and older. This medicine may be used for other purposes; ask your health care provider or pharmacist if you have questions. COMMON BRAND NAME(S): Prevnar 20 What should I tell my care team before I take this medication? They need to know if you have any of these conditions: bleeding disorder fever immune system problems an unusual or allergic reaction to pneumococcal vaccine, diphtheria toxoid, other vaccines, other medicines, foods, dyes, or preservatives pregnant or trying to get pregnant breast-feeding How should I use this medication? This vaccine is injected into a muscle. It is given by a health care provider. A copy of Vaccine Information Statements will be given before each vaccination. Be sure to read this information carefully each time. This sheet may change often. Talk to your health care provider about the use of this medicine in children. Special care may be needed. Overdosage: If you think you have taken too much of this medicine contact a poison control center or emergency room at once. NOTE: This medicine is only for you. Do not share this medicine with others. What if I miss a dose? This does not apply. This medicine is not for regular use. What may interact with this medication? medicines for cancer chemotherapy medicines that suppress your immune function steroid medicines like prednisone or cortisone This list may not describe all possible interactions. Give your health care provider a list of all the medicines, herbs, non-prescription drugs, or  dietary supplements you use. Also tell them if you smoke, drink alcohol, or use illegal drugs. Some items may interact with your medicine. What should I watch for while using this medication? Mild fever and pain should go away in 3 days or less. Report any unusual symptoms to your health care provider. What side effects may I notice from receiving this medication? Side effects that you should report to your doctor or health care professional as soon as possible: allergic reactions (skin rash, itching or hives; swelling of the face, lips, or tongue) confusion fast, irregular heartbeat fever over 102 degrees F muscle weakness seizures trouble breathing unusual bruising or bleeding Side effects that usually do not require medical attention (report to your doctor or health care professional if they continue or are bothersome): fever of 102 degrees F or less headache joint pain muscle cramps, pain pain, tender at site where injected This list may not describe all possible side effects. Call your doctor for medical advice about side effects. You may report side effects to FDA at 1-800-FDA-1088. Where should I keep my medication? This vaccine is only given by a health care provider. It will not be stored at home. NOTE: This sheet is a summary. It may not cover all possible information. If you have questions about this medicine, talk to your doctor, pharmacist, or health care provider.  2023 Elsevier/Gold Standard (2020-04-10 00:00:00)

## 2022-02-24 LAB — MICROALBUMIN / CREATININE URINE RATIO
Creatinine, Urine: 50 mg/dL (ref 20–320)
Microalb, Ur: <0.2 mg/dL

## 2022-02-24 LAB — URINALYSIS, ROUTINE W REFLEX MICROSCOPIC
Bilirubin Urine: NEGATIVE
Glucose, UA: NEGATIVE
Hgb urine dipstick: NEGATIVE
Ketones, ur: NEGATIVE
Leukocytes,Ua: NEGATIVE
Nitrite: NEGATIVE
Protein, ur: NEGATIVE
Specific Gravity, Urine: 1.01 (ref 1.001–1.035)
pH: 6 (ref 5.0–8.0)

## 2022-02-24 LAB — SODIUM, URINE, RANDOM: Sodium, Ur: 69 mmol/L (ref 28–272)

## 2022-02-25 LAB — PROTEIN ELECTROPHORESIS, SERUM
Albumin ELP: 4.2 g/dL (ref 3.8–4.8)
Alpha 1: 0.2 g/dL (ref 0.2–0.3)
Alpha 2: 0.7 g/dL (ref 0.5–0.9)
Beta 2: 0.3 g/dL (ref 0.2–0.5)
Beta Globulin: 0.4 g/dL (ref 0.4–0.6)
Gamma Globulin: 0.8 g/dL (ref 0.8–1.7)
Total Protein: 6.5 g/dL (ref 6.1–8.1)

## 2022-02-26 LAB — PROTEIN ELECTROPHORESIS, URINE REFLEX
Albumin ELP, Urine: 50.7 %
Alpha-1-Globulin, U: 6.5 %
Alpha-2-Globulin, U: 7.7 %
Beta Globulin, U: 23.6 %
Gamma Globulin, U: 11.5 %
Protein, Ur: 4.5 mg/dL

## 2022-08-30 ENCOUNTER — Encounter: Payer: BC Managed Care – PPO | Admitting: Family Medicine

## 2023-02-27 IMAGING — CT CT HEART MORP W/ CTA COR W/ SCORE W/ CA W/CM &/OR W/O CM
1 of 14 series · 3 of 20 positions shown, 4 images · non-contrast
Comparison: None.

Addendum:
CLINICAL DATA: Chest pain

EXAM:
Cardiac/Coronary  CTA
TECHNIQUE: The patient was scanned on a Siemens Somatoform go.Top scanner.

[Series 38: ms multiphase cta coronary 0.60 · axial · 0.36mm/px · z∈[-1076,-1020]mm · 3 of 2529 slices shown, 4 images]
[im 633/2529  vessel]
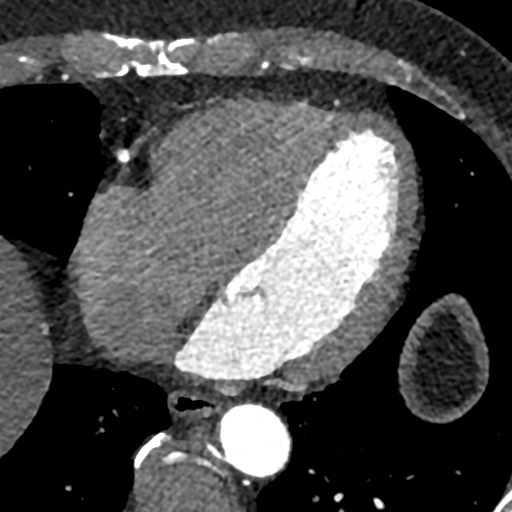
[im 633/2529  lung]
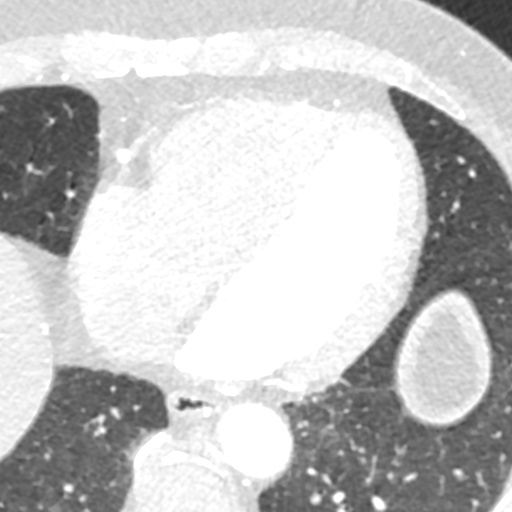
[im 1265/2529  vessel]
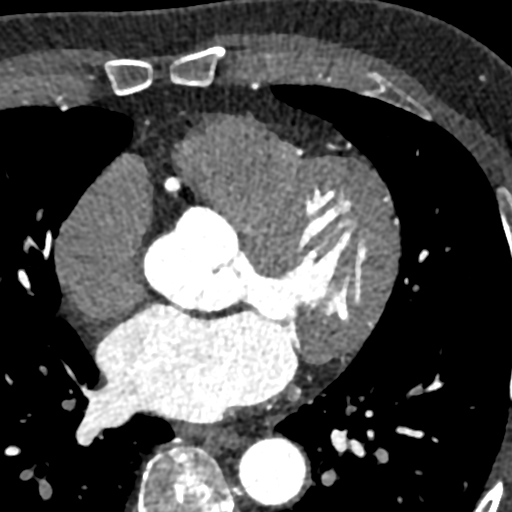
[im 1897/2529  vessel]
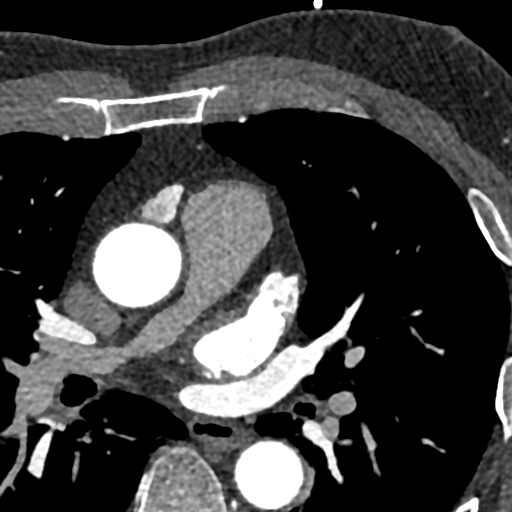

[3 of 20 positions shown; findings below may reference images not displayed]

FINDINGS: A retrospective scan was triggered in the descending thoracic aorta.
Axial non-contrast 3 mm slices were carried out through the heart.
The data set was analyzed on a dedicated work station and scored
using the Agatson method. Gantry rotation speed was 330 msecs and
collimation was .6 mm. 100mg of metoprolol and 0.8 mg of sl NTG was
given. The 3D data set was reconstructed in 5% intervals of the
60-95 % of the R-R cycle. Diastolic phases were analyzed on a
dedicated work station using MPR, MIP and VRT modes. The patient
received 75 cc of contrast.

Aorta:  Normal size.  Aortic root calcifications.  No dissection.

Aortic Valve:  Trileaflet.  No calcifications.

Coronary Arteries:  Normal coronary origin.  Right dominance.

RCA is a dominant artery that gives rise to PDA and PLA. There is no
plaque.

Left main is a large artery that gives rise to LAD and LCX arteries.
There is no LM disease.

LAD has no angiographic evidence of disease.

LCX is a non-dominant artery that gives rise to one OM1 branch.
There is no plaque.

Other findings:

Normal pulmonary vein drainage into the left atrium.

Normal left atrial appendage without a thrombus.

Normal size of the pulmonary artery.
IMPRESSION: 1. Coronary calcium score of 1.04. This was 24th percentile for age
and sex matched control.

2. Normal coronary origin with right dominance.

3. No angiographic evidence of CAD.

4. Consider non-atherosclerotic causes of chest pain.

EXAM:
OVER-READ INTERPRETATION  CT CHEST

The following report is an over-read performed by radiologist Dr.
does not include interpretation of cardiac or coronary anatomy or
pathology. The coronary calcium score/coronary CTA interpretation by
the cardiologist is attached.
FINDINGS: The visualized portions of the lower lung fields show no suspicious
nodules, masses, or infiltrates. No pleural fluid seen.

The visualized portions of the mediastinum and chest wall are
unremarkable.
IMPRESSION: No significant non-cardiovascular abnormality in visualized portion
of the thorax.

*** End of Addendum ***
FINDINGS: A retrospective scan was triggered in the descending thoracic aorta.
Axial non-contrast 3 mm slices were carried out through the heart.
The data set was analyzed on a dedicated work station and scored
using the Agatson method. Gantry rotation speed was 330 msecs and
collimation was .6 mm. 100mg of metoprolol and 0.8 mg of sl NTG was
given. The 3D data set was reconstructed in 5% intervals of the
60-95 % of the R-R cycle. Diastolic phases were analyzed on a
dedicated work station using MPR, MIP and VRT modes. The patient
received 75 cc of contrast.

Aorta:  Normal size.  Aortic root calcifications.  No dissection.

Aortic Valve:  Trileaflet.  No calcifications.

Coronary Arteries:  Normal coronary origin.  Right dominance.

RCA is a dominant artery that gives rise to PDA and PLA. There is no
plaque.

Left main is a large artery that gives rise to LAD and LCX arteries.
There is no LM disease.

LAD has no angiographic evidence of disease.

LCX is a non-dominant artery that gives rise to one OM1 branch.
There is no plaque.

Other findings:

Normal pulmonary vein drainage into the left atrium.

Normal left atrial appendage without a thrombus.

Normal size of the pulmonary artery.
IMPRESSION: 1. Coronary calcium score of 1.04. This was 24th percentile for age
and sex matched control.

2. Normal coronary origin with right dominance.

3. No angiographic evidence of CAD.

4. Consider non-atherosclerotic causes of chest pain.

## 2023-02-28 ENCOUNTER — Ambulatory Visit: Payer: BC Managed Care – PPO | Admitting: Family Medicine

## 2023-02-28 VITALS — BP 118/70 | HR 62 | Temp 98.2°F | Ht 68.0 in | Wt 166.0 lb

## 2023-02-28 DIAGNOSIS — I10 Essential (primary) hypertension: Secondary | ICD-10-CM | POA: Diagnosis not present

## 2023-02-28 DIAGNOSIS — E785 Hyperlipidemia, unspecified: Secondary | ICD-10-CM

## 2023-02-28 DIAGNOSIS — E538 Deficiency of other specified B group vitamins: Secondary | ICD-10-CM

## 2023-02-28 DIAGNOSIS — Z125 Encounter for screening for malignant neoplasm of prostate: Secondary | ICD-10-CM

## 2023-02-28 DIAGNOSIS — R7309 Other abnormal glucose: Secondary | ICD-10-CM | POA: Diagnosis not present

## 2023-02-28 DIAGNOSIS — E559 Vitamin D deficiency, unspecified: Secondary | ICD-10-CM

## 2023-02-28 DIAGNOSIS — I493 Ventricular premature depolarization: Secondary | ICD-10-CM

## 2023-02-28 DIAGNOSIS — R7303 Prediabetes: Secondary | ICD-10-CM

## 2023-02-28 NOTE — Patient Instructions (Signed)
It was a pleasure meeting you today. Thank you for allowing me to take part in your health care.  Our goals for today as we discussed include:  Please schedule lab appointment. Fast for 10 hours  Follow up as needed with PCP   If you have any questions or concerns, please do not hesitate to call the office at [phone].  I look forward to our next visit and until then take care and stay safe.  Regards,   Dana Allan, MD   [facility]

## 2023-02-28 NOTE — Progress Notes (Signed)
SUBJECTIVE:   Chief Complaint  Patient presents with   Transitions Of Care   HPI Presents to clinic to transfer care  No acute concerns today  PVC's/HTN Takes Carvedilol 3.125 mg BID.  Has not had any recurrence of increased heart rate.  Continues to drink caffeine.  Last seen by cardiology 05/22 and had complete work up.  Patient reports all negative.    He is tolerating medication well.   PERTINENT PMH / PSH: HTN  OBJECTIVE:  BP 118/70 (BP Location: Left Arm, Patient Position: Sitting, Cuff Size: Normal)   Pulse 62   Temp 98.2 F (36.8 C) (Oral)   Ht 5\' 8"  (1.727 m)   Wt 166 lb (75.3 kg)   SpO2 96%   BMI 25.24 kg/m    Physical Exam Constitutional:      General: He is not in acute distress.    Appearance: He is normal weight. He is not ill-appearing.  HENT:     Head: Normocephalic.     Right Ear: Tympanic membrane, ear canal and external ear normal.     Left Ear: Tympanic membrane, ear canal and external ear normal.  Eyes:     Conjunctiva/sclera: Conjunctivae normal.  Neck:     Thyroid: No thyromegaly or thyroid tenderness.  Cardiovascular:     Rate and Rhythm: Normal rate and regular rhythm.     Pulses: Normal pulses.  Pulmonary:     Effort: Pulmonary effort is normal.     Breath sounds: Normal breath sounds.  Abdominal:     General: Bowel sounds are normal.  Lymphadenopathy:     Cervical: No cervical adenopathy.  Neurological:     Mental Status: He is alert. Mental status is at baseline.  Psychiatric:        Mood and Affect: Mood normal.        Behavior: Behavior normal.        Thought Content: Thought content normal.        Judgment: Judgment normal.        02/28/2023    9:45 AM 02/23/2022    9:06 AM 02/20/2021   10:38 AM  Depression screen PHQ 2/9  Decreased Interest 0 0 0  Down, Depressed, Hopeless 0 0 0  PHQ - 2 Score 0 0 0  Altered sleeping 0    Tired, decreased energy 0    Change in appetite 0    Feeling bad or failure about yourself   0    Trouble concentrating 0    Moving slowly or fidgety/restless 0    Suicidal thoughts 0    PHQ-9 Score 0    Difficult doing work/chores Not difficult at all        02/28/2023    9:45 AM  GAD 7 : Generalized Anxiety Score  Nervous, Anxious, on Edge 0  Control/stop worrying 0  Worry too much - different things 0  Trouble relaxing 0  Restless 0  Easily annoyed or irritable 0  Afraid - awful might happen 0  Total GAD 7 Score 0    ASSESSMENT/PLAN:  Prostate cancer screening -     PSA; Future  Abnormal glucose -     Hemoglobin A1c; Future  Hyperlipidemia, unspecified hyperlipidemia type Assessment & Plan: Chronic The 10-year ASCVD risk score (Arnett DK, et al., 2019) is: 16%  Recommend statin, pt prefers lifestyle changes at this time Recheck fasting lipids  Orders: -     Lipid panel; Future -     CBC; Future  Hypertension, unspecified type Assessment & Plan: Chronic. Well controlled Continue Carvedilol 3.15 mg BID CMet  Orders: -     Comprehensive metabolic panel; Future  Vitamin B 12 deficiency -     Vitamin B12; Future  Vitamin D deficiency -     VITAMIN D 25 Hydroxy (Vit-D Deficiency, Fractures); Future  PVC's (premature ventricular contractions) Assessment & Plan: Chronic Asymptomatic Check TSH Continue Carvedilol 3.125 mg BID Follow up with Cardiology as needed  Orders: -     TSH; Future  Prediabetes Assessment & Plan: Check A1c    PDMP reviewed  Return if symptoms worsen or fail to improve, for PCP.  Dana Allan, MD

## 2023-03-08 ENCOUNTER — Other Ambulatory Visit (INDEPENDENT_AMBULATORY_CARE_PROVIDER_SITE_OTHER): Payer: BC Managed Care – PPO

## 2023-03-08 DIAGNOSIS — E559 Vitamin D deficiency, unspecified: Secondary | ICD-10-CM

## 2023-03-08 DIAGNOSIS — E785 Hyperlipidemia, unspecified: Secondary | ICD-10-CM

## 2023-03-08 DIAGNOSIS — E538 Deficiency of other specified B group vitamins: Secondary | ICD-10-CM

## 2023-03-08 DIAGNOSIS — I10 Essential (primary) hypertension: Secondary | ICD-10-CM | POA: Diagnosis not present

## 2023-03-08 DIAGNOSIS — R7309 Other abnormal glucose: Secondary | ICD-10-CM

## 2023-03-08 DIAGNOSIS — I493 Ventricular premature depolarization: Secondary | ICD-10-CM | POA: Diagnosis not present

## 2023-03-08 DIAGNOSIS — Z125 Encounter for screening for malignant neoplasm of prostate: Secondary | ICD-10-CM | POA: Diagnosis not present

## 2023-03-08 LAB — COMPREHENSIVE METABOLIC PANEL
ALT: 19 U/L (ref 0–53)
AST: 23 U/L (ref 0–37)
Albumin: 4.1 g/dL (ref 3.5–5.2)
Alkaline Phosphatase: 53 U/L (ref 39–117)
BUN: 23 mg/dL (ref 6–23)
CO2: 24 mEq/L (ref 19–32)
Calcium: 8.9 mg/dL (ref 8.4–10.5)
Chloride: 105 mEq/L (ref 96–112)
Creatinine, Ser: 1.26 mg/dL (ref 0.40–1.50)
GFR: 59.31 mL/min — ABNORMAL LOW (ref >60.00)
Glucose, Bld: 110 mg/dL — ABNORMAL HIGH (ref 70–99)
Potassium: 4.4 mEq/L (ref 3.5–5.1)
Sodium: 136 mEq/L (ref 135–145)
Total Bilirubin: 0.5 mg/dL (ref 0.2–1.2)
Total Protein: 6.1 g/dL (ref 6.0–8.3)

## 2023-03-08 LAB — CBC
HCT: 42.6 % (ref 39.0–52.0)
Hemoglobin: 14 g/dL (ref 13.0–17.0)
MCHC: 33 g/dL (ref 30.0–36.0)
MCV: 89.3 fl (ref 78.0–100.0)
Platelets: 231 10*3/uL (ref 150.0–400.0)
RBC: 4.76 Mil/uL (ref 4.22–5.81)
RDW: 12.7 % (ref 11.5–15.5)
WBC: 4.2 10*3/uL (ref 4.0–10.5)

## 2023-03-08 LAB — VITAMIN B12: Vitamin B-12: 534 pg/mL (ref 211–911)

## 2023-03-08 LAB — LIPID PANEL
Cholesterol: 224 mg/dL — ABNORMAL HIGH (ref 0–200)
HDL: 43.9 mg/dL (ref >39.00)
LDL Cholesterol: 166 mg/dL — ABNORMAL HIGH (ref 0–99)
NonHDL: 180.15
Total CHOL/HDL Ratio: 5
Triglycerides: 69 mg/dL (ref 0.0–149.0)
VLDL: 13.8 mg/dL (ref 0.0–40.0)

## 2023-03-08 LAB — TSH: TSH: 1.97 u[IU]/mL (ref 0.35–5.50)

## 2023-03-08 LAB — PSA: PSA: 0.31 ng/mL (ref 0.10–4.00)

## 2023-03-08 LAB — HEMOGLOBIN A1C: Hgb A1c MFr Bld: 5.9 % (ref 4.6–6.5)

## 2023-03-08 LAB — VITAMIN D 25 HYDROXY (VIT D DEFICIENCY, FRACTURES): VITD: 74.07 ng/mL (ref 30.00–100.00)

## 2023-03-12 ENCOUNTER — Encounter: Payer: Self-pay | Admitting: Family Medicine

## 2023-03-12 NOTE — Assessment & Plan Note (Signed)
Chronic. Well controlled Continue Carvedilol 3.15 mg BID CMet

## 2023-03-12 NOTE — Assessment & Plan Note (Signed)
Chronic The 10-year ASCVD risk score (Arnett DK, et al., 2019) is: 16%  Recommend statin, pt prefers lifestyle changes at this time Recheck fasting lipids

## 2023-03-12 NOTE — Assessment & Plan Note (Signed)
Check A1c 

## 2023-03-12 NOTE — Assessment & Plan Note (Addendum)
Chronic Asymptomatic Check TSH Continue Carvedilol 3.125 mg BID Follow up with Cardiology as needed

## 2023-03-24 ENCOUNTER — Telehealth: Payer: Self-pay | Admitting: Cardiovascular Disease

## 2023-03-24 NOTE — Telephone Encounter (Signed)
Please contact pt for future appointment. Pt hasn't been seen since 2022. Pt needs to be seen yearly or contact pcp for refill.

## 2023-03-24 NOTE — Telephone Encounter (Signed)
Called patient to schedule overdue appt. Unable to leave voicemail.

## 2023-03-24 NOTE — Telephone Encounter (Signed)
*  STAT* If patient is at the pharmacy, call can be transferred to refill team.   1. Which medications need to be refilled? (please list name of each medication and dose if known)   carvedilol (COREG) 3.125 MG tablet   2. Would you like to learn more about the convenience, safety, & potential cost savings by using the [facility]?   3. Are you open to using the [facility] (Type [facility]. ).  4. Which pharmacy/location (including street and city if local pharmacy) is medication to be sent to?  [id number] - [place], [place] - [address]   5. Do they need a 30 day or 90 day supply?   90 day  Patient stated he has about a week's supply of this medication.

## 2023-03-25 NOTE — Telephone Encounter (Signed)
Called on 8/15 to schedule overdue appt, unable to leave voicemail due to mailbox full

## 2023-03-28 ENCOUNTER — Telehealth: Payer: Self-pay | Admitting: Cardiovascular Disease

## 2023-03-28 NOTE — Telephone Encounter (Signed)
Left voice mail to schedule appt

## 2023-03-29 NOTE — Telephone Encounter (Signed)
Pt is scheduled on 8/22

## 2023-03-29 NOTE — Telephone Encounter (Signed)
Please advise if ok to refill pt hasn't been seen since 2022. Pt has future appointment schedule with Fransico Michael.

## 2023-03-31 ENCOUNTER — Ambulatory Visit: Payer: BC Managed Care – PPO | Attending: Medical | Admitting: Medical

## 2023-03-31 ENCOUNTER — Encounter: Payer: Self-pay | Admitting: Medical

## 2023-03-31 VITALS — BP 128/78 | HR 55 | Ht 67.0 in | Wt 166.0 lb

## 2023-03-31 DIAGNOSIS — I10 Essential (primary) hypertension: Secondary | ICD-10-CM | POA: Diagnosis not present

## 2023-03-31 DIAGNOSIS — I493 Ventricular premature depolarization: Secondary | ICD-10-CM | POA: Diagnosis not present

## 2023-03-31 MED ORDER — CARVEDILOL 3.125 MG PO TABS
3.1250 mg | ORAL_TABLET | Freq: Two times a day (BID) | ORAL | 3 refills | Status: DC
Start: 2023-03-31 — End: 2023-06-22

## 2023-03-31 NOTE — Progress Notes (Signed)
Cardiology Office Note:    Date:  03/31/2023   ID:  Tim Reynolds, DOB 1956-04-26, MRN [id number]  PCP:  Dana Allan, MD  [facility] Cardiologist:  Julien Nordmann, MD  [facility] Electrophysiologist:  None   Referring MD: Dana Allan, MD   Chief Complaint: overdue follow-up  History of Present Illness:    Tim Reynolds is a 67 y.o. male with a hx of palpitations, PVCs, coronary calcium score of 1.04, no significant CAD who presents for follow-up.   Cardiac CTA 2022 showed coronary calcium score of 1.04, 24th percentile for age and sex matched, no CAD.  Heart monitor in 2022 showed NSR, 1 run of VT lasting 11 beats, 7 runs of SVT, rare ectopy.   Today, the patient reports he is overall doing well. He denies chest pain, SOB, palpitations, lower leg edema, orthopnea or pnd. He takes the coreg every day and does not notice any fluttering or heart racing. BP is abnormally high today. He is a Runner, broadcasting/film/video. He walks in the evenings. He needs refills of Coreg. Recent labs reviewed.   Past Medical History:  Diagnosis Date   Anxiety    Chicken pox    Depression    GERD (gastroesophageal reflux disease)    OTC meds PRN-with certain foods   Hay fever    HTN (hypertension)    Hyperlipidemia    diet controlled   Kidney stone    10-15 years as of 02/20/21   PVC's (premature ventricular contractions)    Seasonal allergies     Past Surgical History:  Procedure Laterality Date   NO PAST SURGERIES      Current Medications: Current Meds  Medication Sig   carvedilol (COREG) 3.125 MG tablet Take 1 tablet (3.125 mg total) by mouth 2 (two) times daily with a meal.   Cholecalciferol (VITAMIN D3 PO) Take 1 tablet by mouth daily at 6 (six) AM. 4000 IU qd     Allergies:   Other   Social History   Socioeconomic History   Marital status: Married    Spouse name: Not on file   Number of children: Not on file   Years of education: Not on file   Highest education level: Not on file   Occupational History   Not on file  Tobacco Use   Smoking status: Never   Smokeless tobacco: Never  Vaping Use   Vaping status: Never Used  Substance and Sexual Activity   Alcohol use: Yes    Comment: occassional   Drug use: Never   Sexual activity: Never  Other Topics Concern   Not on file  Social History Narrative   BS degree teacher AP biology teacher [employer]   Married Medical laboratory scientific officer    Social Determinants of Health   Financial Resource Strain: Not on file  Food Insecurity: Not on file  Transportation Needs: Not on file  Physical Activity: Not on file  Stress: Not on file  Social Connections: Not on file     Family History: The patient's family history includes Arrhythmia in his mother; Cirrhosis in his father; Hearing loss in his mother; Heart Problems in his father and maternal grandfather; Hypertension in his mother and sister; Seizures in his father; Stroke in his father and maternal grandmother. There is no history of Colon polyps, Colon cancer, Esophageal cancer, Rectal cancer, or Stomach cancer.  ROS:   Please see the history of present illness.     All other systems reviewed and are negative.  EKGs/Labs/Other Studies Reviewed:    The following studies were reviewed today:  Heart monitor 01/2021 Event monitor Patch Wear Time:  13 days and 21 hours (2022-05-23T19:18:41-0400 to 2022-06-06T16:22:58-0400)   Patient had a min HR of 46 bpm, max HR of 174 bpm, and avg HR of 67 bpm.  Predominant underlying rhythm was Sinus Rhythm.    1 run of Ventricular Tachycardia occurred lasting 11 beats with a max rate of 174 bpm (avg 159 bpm).    7 Supraventricular Tachycardia runs occurred, the run with the fastest interval lasting 7 beats with a max rate of 143 bpm, the longest lasting 14 beats with an avg rate of 111 bpm.     Isolated SVEs were rare (<1.0%, 482), SVE Couplets were rare (<1.0%, 26), and no SVE Triplets were present. Isolated VEs were rare (<1.0%), and no VE  Couplets or VE Triplets were present. Ventricular Bigeminy and Trigeminy were present.    Patient triggered events (>80)  associated with sinus rhythm and rare PVCs   Signed, Dossie Arbour, MD, Ph.D [facility]    Cardiac CTA 12/2020 IMPRESSION: 1. Coronary calcium score of 1.04. This was 24th percentile for age and sex matched control.   2. Normal coronary origin with right dominance.   3. No angiographic evidence of CAD.   4. Consider non-atherosclerotic causes of chest pain.   Electronically Signed: By: Debbe Odea M.D. On: 12/11/2020 16:45  EKG:  EKG is ordered today.  The ekg ordered today demonstrates SB 55bpm, LAD, TWI III  Recent Labs: 03/08/2023: ALT 19; BUN 23; Creatinine, Ser 1.26; Hemoglobin 14.0; Platelets 231.0; Potassium 4.4; Sodium 136; TSH 1.97  Recent Lipid Panel    Component Value Date/Time   CHOL 224 (H) 03/08/2023 1005   TRIG 69.0 03/08/2023 1005   HDL 43.90 03/08/2023 1005   CHOLHDL 5 03/08/2023 1005   VLDL 13.8 03/08/2023 1005   LDLCALC 166 (H) 03/08/2023 1005    Physical Exam:    VS:  BP 128/78   Pulse (!) 55   Ht 5\' 7"  (1.702 m)   Wt 166 lb (75.3 kg)   SpO2 98%   BMI 26.00 kg/m     Wt Readings from Last 3 Encounters:  03/31/23 166 lb (75.3 kg)  02/28/23 166 lb (75.3 kg)  02/23/22 170 lb 12.8 oz (77.5 kg)     GEN:  Well nourished, well developed in no acute distress HEENT: Normal NECK: No JVD; No carotid bruits LYMPHATICS: No lymphadenopathy CARDIAC: Bradycardia, RR, no murmurs, rubs, gallops RESPIRATORY:  Clear to auscultation without rales, wheezing or rhonchi  ABDOMEN: Soft, non-tender, non-distended MUSCULOSKELETAL:  No edema; No deformity  SKIN: Warm and dry NEUROLOGIC:  Alert and oriented x 3 PSYCHIATRIC:  Normal affect   ASSESSMENT:    1. PVC's (premature ventricular contractions)   2. Hypertension, unspecified type    PLAN:    In order of problems listed above:  PVCs Patient denies any palpitations or  heart fluttering. Symptoms stable with Coreg. I will refill coreg 3.125mg  BID today.   HTN Re-check blood pressure 128/78. Refill coreg as above. Patient maintains a healthy lifestyle with exercise and a good diet.   Disposition: Follow up in 1 year(s) with Md/APP    Signed, Geraldin Habermehl Bram Stall, PA-C  03/31/2023 12:21 PM    [facility]

## 2023-03-31 NOTE — Patient Instructions (Signed)
Medication Instructions:  Your physician recommends that you continue on your current medications as directed. Please refer to the Current Medication list given to you today.   *If you need a refill on your cardiac medications before your next appointment, please call your pharmacy*   Lab Work: No labs ordered today    Testing/Procedures: No test ordered today    Follow-Up: At [facility], you and your health needs are our priority.  As part of our continuing mission to provide you with exceptional heart care, we have created designated Provider Care Teams.  These Care Teams include your primary Cardiologist (physician) and Advanced Practice Providers (APPs -  Physician Assistants and Nurse Practitioners) who all work together to provide you with the care you need, when you need it.  We recommend signing up for the patient portal called "MyChart".  Sign up information is provided on this After Visit Summary.  MyChart is used to connect with patients for Virtual Visits (Telemedicine).  Patients are able to view lab/test results, encounter notes, upcoming appointments, etc.  Non-urgent messages can be sent to your provider as well.   To learn more about what you can do with MyChart, go to [email or web address].    Your next appointment:   1 year(s)  Provider:   You may see Julien Nordmann, MD or one of the following Advanced Practice Providers on your designated Care Team:   Nicolasa Ducking, NP Eula Listen, PA-C Cadence Fransico Michael, PA-C Charlsie Quest, NP

## 2023-04-04 NOTE — Telephone Encounter (Signed)
Patient seen in clinic on 03/31/23. Refills placed at that time.

## 2023-06-22 ENCOUNTER — Telehealth: Payer: Self-pay | Admitting: Cardiovascular Disease

## 2023-06-22 ENCOUNTER — Other Ambulatory Visit: Payer: Self-pay

## 2023-06-22 DIAGNOSIS — I10 Essential (primary) hypertension: Secondary | ICD-10-CM

## 2023-06-22 DIAGNOSIS — I493 Ventricular premature depolarization: Secondary | ICD-10-CM

## 2023-06-22 MED ORDER — CARVEDILOL 3.125 MG PO TABS: 3.1250 mg | ORAL_TABLET | Freq: Two times a day (BID) | ORAL | 3 refills | Status: DC

## 2023-06-22 NOTE — Telephone Encounter (Signed)
RX sent to pharmacy  

## 2023-06-22 NOTE — Telephone Encounter (Signed)
Pt c/o medication issue:  1. Name of Medication: carvedilol (COREG) 3.125 MG tablet   2. How are you currently taking this medication (dosage and times per day)? As prescribed   3. Are you having a reaction (difficulty breathing--STAT)? No   4. What is your medication issue? Patient is going to run out of meds prior to refill being due. Please advise.

## 2024-06-22 ENCOUNTER — Other Ambulatory Visit: Payer: Self-pay | Admitting: Medical

## 2024-06-22 DIAGNOSIS — I10 Essential (primary) hypertension: Secondary | ICD-10-CM

## 2024-06-22 DIAGNOSIS — I493 Ventricular premature depolarization: Secondary | ICD-10-CM

## 2024-07-17 ENCOUNTER — Ambulatory Visit: Admitting: Nurse Practitioner

## 2024-07-17 VITALS — BP 138/88 | HR 63 | Temp 98.3°F | Ht 67.0 in | Wt 176.4 lb

## 2024-07-17 DIAGNOSIS — Z1329 Encounter for screening for other suspected endocrine disorder: Secondary | ICD-10-CM

## 2024-07-17 DIAGNOSIS — R7303 Prediabetes: Secondary | ICD-10-CM

## 2024-07-17 DIAGNOSIS — Z0000 Encounter for general adult medical examination without abnormal findings: Secondary | ICD-10-CM

## 2024-07-17 DIAGNOSIS — E785 Hyperlipidemia, unspecified: Secondary | ICD-10-CM | POA: Diagnosis not present

## 2024-07-17 DIAGNOSIS — E559 Vitamin D deficiency, unspecified: Secondary | ICD-10-CM | POA: Diagnosis not present

## 2024-07-17 DIAGNOSIS — I10 Essential (primary) hypertension: Secondary | ICD-10-CM

## 2024-07-17 DIAGNOSIS — M180 Bilateral primary osteoarthritis of first carpometacarpal joints: Secondary | ICD-10-CM

## 2024-07-17 DIAGNOSIS — Z125 Encounter for screening for malignant neoplasm of prostate: Secondary | ICD-10-CM | POA: Diagnosis not present

## 2024-07-17 LAB — CBC WITH DIFFERENTIAL/PLATELET
Basophils Absolute: 0 K/uL (ref 0.0–0.1)
Basophils Relative: 0.6 % (ref 0.0–3.0)
Eosinophils Absolute: 0.2 K/uL (ref 0.0–0.7)
Eosinophils Relative: 4 % (ref 0.0–5.0)
HCT: 42.7 % (ref 39.0–52.0)
Hemoglobin: 14.5 g/dL (ref 13.0–17.0)
Lymphocytes Relative: 27.3 % (ref 12.0–46.0)
Lymphs Abs: 1.6 K/uL (ref 0.7–4.0)
MCHC: 33.9 g/dL (ref 30.0–36.0)
MCV: 89.4 fl (ref 78.0–100.0)
Monocytes Absolute: 0.4 K/uL (ref 0.1–1.0)
Monocytes Relative: 6.5 % (ref 3.0–12.0)
Neutro Abs: 3.5 K/uL (ref 1.4–7.7)
Neutrophils Relative %: 61.6 % (ref 43.0–77.0)
Platelets: 270 K/uL (ref 150.0–400.0)
RBC: 4.77 Mil/uL (ref 4.22–5.81)
RDW: 12.5 % (ref 11.5–15.5)
WBC: 5.7 K/uL (ref 4.0–10.5)

## 2024-07-17 LAB — LIPID PANEL
Cholesterol: 236 mg/dL — ABNORMAL HIGH (ref 0–200)
HDL: 44.9 mg/dL (ref >39.00)
LDL Cholesterol: 173 mg/dL — ABNORMAL HIGH (ref 0–99)
NonHDL: 191.43
Total CHOL/HDL Ratio: 5
Triglycerides: 93 mg/dL (ref 0.0–149.0)
VLDL: 18.6 mg/dL (ref 0.0–40.0)

## 2024-07-17 LAB — COMPREHENSIVE METABOLIC PANEL WITH GFR
ALT: 17 U/L (ref 0–53)
AST: 20 U/L (ref 0–37)
Albumin: 4.5 g/dL (ref 3.5–5.2)
Alkaline Phosphatase: 73 U/L (ref 39–117)
BUN: 21 mg/dL (ref 6–23)
CO2: 27 meq/L (ref 19–32)
Calcium: 9.7 mg/dL (ref 8.4–10.5)
Chloride: 103 meq/L (ref 96–112)
Creatinine, Ser: 1.34 mg/dL (ref 0.40–1.50)
GFR: 54.56 mL/min — ABNORMAL LOW (ref >60.00)
Glucose, Bld: 87 mg/dL (ref 70–99)
Potassium: 4.6 meq/L (ref 3.5–5.1)
Sodium: 137 meq/L (ref 135–145)
Total Bilirubin: 0.8 mg/dL (ref 0.2–1.2)
Total Protein: 6.8 g/dL (ref 6.0–8.3)

## 2024-07-17 LAB — PSA: PSA: 0.34 ng/mL (ref 0.10–4.00)

## 2024-07-17 LAB — TSH: TSH: 1.37 u[IU]/mL (ref 0.35–5.50)

## 2024-07-17 LAB — HEMOGLOBIN A1C: Hgb A1c MFr Bld: 5.5 % (ref 4.6–6.5)

## 2024-07-17 LAB — SEDIMENTATION RATE: Sed Rate: 13 mm/h (ref 0–20)

## 2024-07-17 LAB — VITAMIN D 25 HYDROXY (VIT D DEFICIENCY, FRACTURES): VITD: 75.63 ng/mL (ref 30.00–100.00)

## 2024-07-17 NOTE — Progress Notes (Signed)
 " Leron Glance, NP-C Phone: [phone]  Tim Reynolds is a 68 y.o. male who presents today for transfer of care.   Discussed the use of AI scribe software for clinical note transcription with the patient, who gave verbal consent to proceed.  History of Present Illness   Tim Reynolds is a 68 year old male with hypertension and prediabetes who presents for transfer of care with joint pain and stiffness.  He has been experiencing significant joint pain, particularly in his thumbs and knees, for approximately six weeks. The pain is sharp and occurs even when not using his hands. He reports a decrease in grip strength and experiences pain when pressure is applied to his knee. He has not previously experienced arthritis and is concerned about the sudden onset of symptoms.  He has a history of hypertension and is prescribed carvedilol , which he takes inconsistently, sometimes missing doses for two to three days. Since starting carvedilol , he has not experienced palpitations that he previously thought were related to stress or the medication. No chest pain, shortness of breath, dizziness, or leg swelling.  He has a history of high cholesterol but is not currently on medication for it. He takes vitamin D  with potassium and B12 supplements. He manages his prediabetes with diet and exercise, although he reports a decrease in formal exercise since purchasing a farm in [place] . 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  His diet has become less balanced over the past 18 months, with a decrease in vegetable and fruit intake. He typically eats cereal or eggs for breakfast, a sandwich or tuna for lunch, and a salad or soup for dinner. He rarely eats out, except for a weekly date night.  No recent changes in bowel habits, abdominal pain, or urinary symptoms. No headaches, dizziness, or skin changes. He occasionally consumes alcohol, typically one or two drinks per month, and does not use tobacco or  drugs.  His family history includes bladder cancer in his father, but no history of colon or prostate cancer. He is up to date on his colonoscopy and vaccinations, including tetanus, shingles, and pneumonia. He sees a dentist regularly and is in the process of finding a new eye doctor due to insurance changes.      Social History   Tobacco Use  Smoking Status Never  Smokeless Tobacco Never    Current Outpatient Medications on File Prior to Visit  Medication Sig Dispense Refill   Cholecalciferol (VITAMIN D3 PO) Take 1 tablet by mouth daily at 6 (six) AM. 4000 IU qd     No current facility-administered medications on file prior to visit.     ROS see history of present illness  Objective  Physical Exam Vitals:   07/17/24 1127  BP: 138/88  Pulse: 63  Temp: 98.3 F (36.8 C)  SpO2: 97%    BP Readings from Last 3 Encounters:  07/17/24 138/88  03/31/23 128/78  02/28/23 118/70   Wt Readings from Last 3 Encounters:  07/17/24 176 lb 6.4 oz (80 kg)  03/31/23 166 lb (75.3 kg)  02/28/23 166 lb (75.3 kg)    Physical Exam Constitutional:      General: He is not in acute distress.    Appearance: Normal appearance.  HENT:     Head: Normocephalic.     Right Ear: Tympanic membrane normal.     Left Ear: Tympanic membrane normal.     Nose: Nose normal.     Mouth/Throat:     Mouth:  Mucous membranes are moist.     Pharynx: Oropharynx is clear.  Eyes:     Conjunctiva/sclera: Conjunctivae normal.     Pupils: Pupils are equal, round, and reactive to light.  Neck:     Thyroid : No thyromegaly.  Cardiovascular:     Rate and Rhythm: Normal rate and regular rhythm.     Heart sounds: Normal heart sounds.  Pulmonary:     Effort: Pulmonary effort is normal.     Breath sounds: Normal breath sounds.  Abdominal:     General: Abdomen is flat. Bowel sounds are normal.     Palpations: Abdomen is soft. There is no mass.     Tenderness: There is no abdominal tenderness.  Musculoskeletal:         General: Normal range of motion.  Lymphadenopathy:     Cervical: No cervical adenopathy.  Skin:    General: Skin is warm and dry.     Findings: No rash.  Neurological:     General: No focal deficit present.     Mental Status: He is alert.  Psychiatric:        Mood and Affect: Mood normal.        Behavior: Behavior normal.      Assessment/Plan: Please see individual problem list.  Routine general medical examination at a health care facility Assessment & Plan: Physical exam complete. We will check lab work as outlined. Colonoscopy is up to date. Prostate screening ordered today in lab work. He declines flu and additional COVID vaccines. Tetanus vaccine is up to date. Shingles and pneumonia vaccine series completed. He does not smoke or use alcohol regularly. Encouraged finding a new eye doctor due to insurance changes and regular dental check-ups. Continue healthy diet and regular exercise. Return to care in one year, sooner as needed.    Osteoarthritis of both thumbs Assessment & Plan: Pain in thumbs and knee is consistent with osteoarthritis, though rheumatoid arthritis was considered. Recommend ibuprofen 400 mg twice daily as needed, with a maximum of 800 mg three times daily. Discussed NSAID side effects, including gastrointestinal and kidney effects. Consider referral to orthopedics for further evaluation and possible steroid injections if symptoms persist.   Orders: -     Sedimentation rate  Hypertension, unspecified type Assessment & Plan: Hypertension is well controlled with carvedilol , and he reports no symptoms. Continue carvedilol  3.125 twice daily. Follow up with Cardiology as scheduled. Check lab work as outlined.   Orders: -     Comprehensive metabolic panel with GFR -     CBC with Differential/Platelet  Hyperlipidemia, unspecified hyperlipidemia type Assessment & Plan: Managed with healthy diet and regular exercise. Has declined statins in the past.  Counseled risk of elevated cholesterol levels. Check lipid panel today. Consider statin if remaining elevated.   Orders: -     Lipid panel  Prediabetes Assessment & Plan: Managed with healthy diet and regular exercise. Continue. Check A1c.   Orders: -     Hemoglobin A1c  Vitamin D  deficiency Assessment & Plan: Managed with OTC daily supplementation. Continue. Check vitamin D  level.   Orders: -     VITAMIN D  25 Hydroxy (Vit-D Deficiency, Fractures)  Screening PSA (prostate specific antigen) -     PSA  Thyroid  disorder screen -     TSH     Return in about 1 year (around 07/17/2025) for Annual Exam, sooner as needed.   Leron Glance, NP-C [facility] "

## 2024-07-20 ENCOUNTER — Telehealth: Payer: Self-pay | Admitting: Medical

## 2024-07-20 DIAGNOSIS — I493 Ventricular premature depolarization: Secondary | ICD-10-CM

## 2024-07-20 DIAGNOSIS — I10 Essential (primary) hypertension: Secondary | ICD-10-CM

## 2024-07-20 NOTE — Telephone Encounter (Signed)
Please contact pt for future appointment. 

## 2024-07-23 NOTE — Telephone Encounter (Signed)
 Tried to call pt and unable to leave message for medication refill

## 2024-07-31 ENCOUNTER — Ambulatory Visit: Payer: Self-pay | Admitting: Nurse Practitioner

## 2024-08-06 ENCOUNTER — Encounter: Payer: Self-pay | Admitting: Nurse Practitioner

## 2024-08-06 DIAGNOSIS — M180 Bilateral primary osteoarthritis of first carpometacarpal joints: Secondary | ICD-10-CM | POA: Insufficient documentation

## 2024-08-06 NOTE — Assessment & Plan Note (Signed)
 Managed with healthy diet and regular exercise. Continue. Check A1c.

## 2024-08-06 NOTE — Assessment & Plan Note (Signed)
 Pain in thumbs and knee is consistent with osteoarthritis, though rheumatoid arthritis was considered. Recommend ibuprofen 400 mg twice daily as needed, with a maximum of 800 mg three times daily. Discussed NSAID side effects, including gastrointestinal and kidney effects. Consider referral to orthopedics for further evaluation and possible steroid injections if symptoms persist.

## 2024-08-06 NOTE — Assessment & Plan Note (Signed)
 Physical exam complete. We will check lab work as outlined. Colonoscopy is up to date. Prostate screening ordered today in lab work. He declines flu and additional COVID vaccines. Tetanus vaccine is up to date. Shingles and pneumonia vaccine series completed. He does not smoke or use alcohol regularly. Encouraged finding a new eye doctor due to insurance changes and regular dental check-ups. Continue healthy diet and regular exercise. Return to care in one year, sooner as needed.

## 2024-08-06 NOTE — Assessment & Plan Note (Signed)
 Managed with OTC daily supplementation. Continue. Check vitamin D  level.

## 2024-08-06 NOTE — Assessment & Plan Note (Signed)
 Managed with healthy diet and regular exercise. Has declined statins in the past. Counseled risk of elevated cholesterol levels. Check lipid panel today. Consider statin if remaining elevated.

## 2024-08-06 NOTE — Assessment & Plan Note (Signed)
 Hypertension is well controlled with carvedilol , and he reports no symptoms. Continue carvedilol  3.125 twice daily. Follow up with Cardiology as scheduled. Check lab work as outlined.

## 2024-08-10 NOTE — Progress Notes (Signed)
 "  Cardiology Office Note    Date:  08/13/2024   ID:  Alm HERO Tim Reynolds, DOB [birth year], MRN [id number]  PCP:  [name], NP  Cardiologist:  Evalene Lunger, MD  Electrophysiologist:  None   Chief Complaint: Follow up  History of Present Illness:   Tim Reynolds is a 69 y.o. male with history of palpitations and PVCs who presents for follow-up on PVCs.    Patient established with Dr. Gollan 08/2019 after referral for palpitations.  Coronary CTA 12/2020 revealed coronary calcium  score of 1.04 which was 24th percentile for age and sex matched control.  There was no angiographic evidence of CAD.  ZIO monitor 01/2021 revealed predominantly sinus rhythm with 1 run of VT lasting 11 beats and 7 runs of SVT up to 14 beats.   Patient was most recently seen in the cardiology office 03/2023 overall doing well from a cardiac perspective without symptoms of palpitations.  No further testing or medication changes were indicated at that time.  Patient presents today overall doing well from a cardiac perspective.  He reports him and his wife are in the process of renovating a farm in [place]  and have been out of their regular routine of eating healthy and exercising.  He was previously walking several times per week but has been to PCP recently.  Recent lab work with PCP revealed elevated cholesterol.  We reviewed ASCVD risk together.  He has been awaiting a response from his PCP regarding next steps for cholesterol management.  He has intermittent episodes of reflux.  Denies exertional dyspnea and chest pain.  Denies lightheadedness, dizziness, palpitations, and lower extremity swelling.  Labs independently reviewed: 07/2024-Hgb 14.5, HCT 42.7, platelets 270, sodium 137, potassium 4.6, BUN 21, creatinine 1.34, T CO2 36, TG 93, HDL 44, LDL 173  Objective   Past Medical History:  Diagnosis Date   Anxiety    Chicken pox    Depression    GERD (gastroesophageal reflux disease)    OTC meds PRN-with certain foods    Hay fever    HTN (hypertension)    Hyperlipidemia    diet controlled   Kidney stone    10-15 years as of 02/20/21   PVC's (premature ventricular contractions)    Seasonal allergies     Current Medications: Active Medications[1]  Allergies:   Other   Social History   Socioeconomic History   Marital status: Married    Spouse name: Not on file   Number of children: Not on file   Years of education: Not on file   Highest education level: Bachelor's degree (e.g., BA, AB, BS)  Occupational History   Not on file  Tobacco Use   Smoking status: Never   Smokeless tobacco: Never  Vaping Use   Vaping status: Never Used  Substance and Sexual Activity   Alcohol use: Yes    Comment: occassional   Drug use: Never   Sexual activity: Never  Other Topics Concern   Not on file  Social History Narrative   BS degree teacher AP biology teacher [employer]   Married Medical Laboratory Scientific Officer    Social Drivers of Health   Tobacco Use: Low Risk (08/13/2024)   Patient History    Smoking Tobacco Use: Never    Smokeless Tobacco Use: Never    Passive Exposure: Not on file  Financial Resource Strain: Low Risk (07/16/2024)   Overall Financial Resource Strain (CARDIA)    Difficulty of Paying Living Expenses: Not hard at all  Food  Insecurity: No Food Insecurity (07/16/2024)   Epic    Worried About Programme Researcher, Broadcasting/film/video in the Last Year: Never true    Ran Out of Food in the Last Year: Never true  Transportation Needs: No Transportation Needs (07/16/2024)   Epic    Lack of Transportation (Medical): No    Lack of Transportation (Non-Medical): No  Physical Activity: Sufficiently Active (07/16/2024)   Exercise Vital Sign    Days of Exercise per Week: 5 days    Minutes of Exercise per Session: 30 min  Stress: No Stress Concern Present (07/16/2024)   [employer] of Occupational Health - Occupational Stress Questionnaire    Feeling of Stress: Only a little  Social Connections: Moderately Isolated (07/16/2024)    Social Connection and Isolation Panel    Frequency of Communication with Friends and Family: More than three times a week    Frequency of Social Gatherings with Friends and Family: Once a week    Attends Religious Services: Patient declined    Active Member of Clubs or Organizations: No    Attends Engineer, Structural: Not on file    Marital Status: Married  Depression (PHQ2-9): Low Risk (07/17/2024)   Depression (PHQ2-9)    PHQ-2 Score: 1  Alcohol Screen: Low Risk (07/16/2024)   Alcohol Screen    Last Alcohol Screening Score (AUDIT): 1  Housing: Low Risk (07/16/2024)   Epic    Unable to Pay for Housing in the Last Year: No    Number of Times Moved in the Last Year: 0    Homeless in the Last Year: No  Utilities: Not on file  Health Literacy: Not on file     Family History:  The patient's family history includes Arrhythmia in his mother; Cirrhosis in his father; Hearing loss in his mother; Heart Problems in his father and maternal grandfather; Hypertension in his mother and sister; Seizures in his father; Stroke in his father and maternal grandmother. There is no history of Colon polyps, Colon cancer, Esophageal cancer, Rectal cancer, or Stomach cancer.  ROS:   12-point review of systems is negative unless otherwise noted in the HPI.  EKGs/Other Studies Reviewed:    Studies reviewed were summarized above. The additional studies were reviewed today:  01/2021 Long term monitor Patient had a min HR of 46 bpm, max HR of 174 bpm, and avg HR of 67 bpm.  Predominant underlying rhythm was Sinus Rhythm.    1 run of Ventricular Tachycardia occurred lasting 11 beats with a max rate of 174 bpm (avg 159 bpm).    7 Supraventricular Tachycardia runs occurred, the run with the fastest interval lasting 7 beats with a max rate of 143 bpm, the longest lasting 14 beats with an avg rate of 111 bpm.     Isolated SVEs were rare (<1.0%, 482), SVE Couplets were rare (<1.0%, 26), and no SVE  Triplets were present. Isolated VEs were rare (<1.0%), and no VE Couplets or VE Triplets were present. Ventricular Bigeminy and Trigeminy were present.    Patient triggered events (>80)  associated with sinus rhythm and rare PVCs  12/2020 Coronary CTA 1. Coronary calcium  score of 1.04. This was 24th percentile for age and sex matched control. 2. Normal coronary origin with right dominance. 3. No angiographic evidence of CAD. 4. Consider non-atherosclerotic causes of chest pain.  EKG:  EKG personally reviewed by me today EKG Interpretation Date/Time:  Monday August 13 2024 08:58:32 EST Ventricular Rate:  58 PR Interval:  164 QRS Duration:  76 QT Interval:  418 QTC Calculation: 410 R Axis:   0  Text Interpretation: Sinus bradycardia When compared with ECG of 31-Mar-2023 11:58, No significant change was found Confirmed by Lorene Sinclair ([id number]) on 08/13/2024 9:04:07 AM  PHYSICAL EXAM:    VS:  BP 138/78 (BP Location: Left Arm, Patient Position: Sitting, Cuff Size: Normal)   Pulse (!) 58   Ht 5' 9 (1.753 m)   Wt 176 lb 4 oz (79.9 kg)   SpO2 98%   BMI 26.03 kg/m   BMI: Body mass index is 26.03 kg/m.  GEN: Well nourished, well developed in no acute distress NECK: No JVD; No carotid bruits CARDIAC: RRR, no murmurs, rubs, gallops RESPIRATORY:  Clear to auscultation without rales, wheezing or rhonchi  ABDOMEN: Soft, non-tender, non-distended EXTREMITIES:  No edema; No deformity  Wt Readings from Last 3 Encounters:  08/13/24 176 lb 4 oz (79.9 kg)  07/17/24 176 lb 6.4 oz (80 kg)  03/31/23 166 lb (75.3 kg)                  ASSESSMENT & PLAN:   Palpitations PVCs - Asymptomatic on current dose of carvedilol .  Hyperlipidemia - Recent lipid panel 07/2024 with LDL 173.  ASCVD risk 18%.  Recommend starting atorvastatin  10 mg daily with repeat lipid panel and hepatic function in 8 weeks.  Elevated blood pressure - Blood pressure moderately elevated today.  Recommend keeping  log at home for 1 month and sending readings via MyChart.    Disposition: Keep blood pressure log.  Start atorvastatin  10 mg daily with repeat lipid panel and hepatic function in 8 weeks.  Patient is moving out of state and will need to establish with a new cardiologist there.  Recommend 1 year follow-up.   Medication Adjustments/Labs and Tests Ordered: Current medicines are reviewed at length with the patient today.  Concerns regarding medicines are outlined above. Medication changes, Labs and Tests ordered today are summarized above and listed in the Patient Instructions accessible in Encounters.   Bonney Sinclair Lorene, PA-C 08/13/2024 10:30 AM     [facility] [place] [address] [place], [place] [address] [phone]      [1]  Current Meds  Medication Sig   atorvastatin  (LIPITOR) 10 MG tablet Take 1 tablet (10 mg total) by mouth daily.   Cholecalciferol (VITAMIN D3 PO) Take 1 tablet by mouth daily at 6 (six) AM. 4000 IU qd   Cyanocobalamin  (B-12 PO) Take by mouth daily.   [DISCONTINUED] carvedilol  (COREG ) 3.125 MG tablet TAKE 1 TABLET BY MOUTH TWICE A DAY WITH FOOD   "

## 2024-08-13 ENCOUNTER — Encounter: Payer: Self-pay | Admitting: Physician Assistant

## 2024-08-13 ENCOUNTER — Ambulatory Visit: Attending: Physician Assistant | Admitting: Physician Assistant

## 2024-08-13 VITALS — BP 138/78 | HR 58 | Ht 69.0 in | Wt 176.2 lb

## 2024-08-13 DIAGNOSIS — I10 Essential (primary) hypertension: Secondary | ICD-10-CM

## 2024-08-13 DIAGNOSIS — E785 Hyperlipidemia, unspecified: Secondary | ICD-10-CM | POA: Diagnosis not present

## 2024-08-13 DIAGNOSIS — I493 Ventricular premature depolarization: Secondary | ICD-10-CM

## 2024-08-13 MED ORDER — CARVEDILOL 3.125 MG PO TABS: 3.1250 mg | ORAL_TABLET | Freq: Two times a day (BID) | ORAL | 3 refills | Status: AC

## 2024-08-13 MED ORDER — ATORVASTATIN CALCIUM 10 MG PO TABS: 10.0000 mg | ORAL_TABLET | Freq: Every day | ORAL | 3 refills | Status: AC

## 2024-08-13 NOTE — Patient Instructions (Signed)
 Medication Instructions:  Your physician recommends the following medication changes.  START TAKING: ATORVASTATIN  10MG  DAILY   *If you need a refill on your cardiac medications before your next appointment, please call your pharmacy*  Lab Work: No labs ordered today  If you have labs (blood work) drawn today and your tests are completely normal, you will receive your results only by: MyChart Message (if you have MyChart) OR A paper copy in the mail If you have any lab test that is abnormal or we need to change your treatment, we will call you to review the results.  Testing/Procedures: No test ordered today   Follow-Up: At [facility], you and your health needs are our priority.  As part of our continuing mission to provide you with exceptional heart care, our providers are all part of one team.  This team includes your primary Cardiologist (physician) and Advanced Practice Providers or APPs (Physician Assistants and Nurse Practitioners) who all work together to provide you with the care you need, when you need it.  Your next appointment:     Provider:   You may see Timothy Gollan, MD or one of the following Advanced Practice Providers on your designated Care Team:   Lonni Meager, NP Lesley Maffucci, PA-C Bernardino Bring, PA-C Cadence Switzer, PA-C Tylene Lunch, NP Barnie Hila, NP

## 2025-07-18 ENCOUNTER — Encounter: Admitting: Nurse Practitioner
